# Patient Record
Sex: Male | Born: 1975 | Race: Black or African American | Hispanic: No | Marital: Single | Smoking: Never smoker
Health system: Southern US, Community
[De-identification: ages and names within clinical notes are randomized; demographics above are authoritative.]

## PROBLEM LIST (undated history)

## (undated) DIAGNOSIS — F840 Autistic disorder: Secondary | ICD-10-CM

---

## 1999-07-27 ENCOUNTER — Inpatient Hospital Stay (HOSPITAL_COMMUNITY): Admission: EM | Admit: 1999-07-27 | Discharge: 1999-07-28 | Payer: Self-pay | Admitting: *Deleted

## 2000-12-11 ENCOUNTER — Emergency Department (HOSPITAL_COMMUNITY): Admission: EM | Admit: 2000-12-11 | Discharge: 2000-12-11 | Payer: Self-pay | Admitting: Internal Medicine

## 2000-12-11 ENCOUNTER — Encounter: Payer: Self-pay | Admitting: Internal Medicine

## 2011-11-14 ENCOUNTER — Emergency Department (HOSPITAL_COMMUNITY)
Admission: EM | Admit: 2011-11-14 | Discharge: 2011-11-14 | Disposition: A | Discharge status: Home / Self Care | Payer: Medicare Other | Attending: Emergency Medicine | Admitting: Emergency Medicine

## 2011-11-14 ENCOUNTER — Encounter (HOSPITAL_COMMUNITY): Payer: Self-pay | Admitting: *Deleted

## 2011-11-14 DIAGNOSIS — K529 Noninfective gastroenteritis and colitis, unspecified: Secondary | ICD-10-CM

## 2011-11-14 DIAGNOSIS — F840 Autistic disorder: Secondary | ICD-10-CM | POA: Insufficient documentation

## 2011-11-14 DIAGNOSIS — R197 Diarrhea, unspecified: Secondary | ICD-10-CM | POA: Insufficient documentation

## 2011-11-14 DIAGNOSIS — R109 Unspecified abdominal pain: Secondary | ICD-10-CM | POA: Insufficient documentation

## 2011-11-14 DIAGNOSIS — E860 Dehydration: Secondary | ICD-10-CM

## 2011-11-14 HISTORY — DX: Autistic disorder: F84.0

## 2011-11-14 LAB — COMPREHENSIVE METABOLIC PANEL
ALT: 14 U/L (ref 0–53)
AST: 19 U/L (ref 0–37)
Albumin: 3.2 g/dL — ABNORMAL LOW (ref 3.5–5.2)
Alkaline Phosphatase: 51 U/L (ref 39–117)
BUN: 25 mg/dL — ABNORMAL HIGH (ref 6–23)
CO2: 27 mEq/L (ref 19–32)
Calcium: 8.6 mg/dL (ref 8.4–10.5)
Chloride: 101 mEq/L (ref 96–112)
Creatinine, Ser: 1.51 mg/dL — ABNORMAL HIGH (ref 0.50–1.35)
GFR calc Af Amer: 68 mL/min — ABNORMAL LOW (ref >90)
GFR calc non Af Amer: 58 mL/min — ABNORMAL LOW (ref >90)
Glucose, Bld: 113 mg/dL — ABNORMAL HIGH (ref 70–99)
Potassium: 3.2 mEq/L — ABNORMAL LOW (ref 3.5–5.1)
Sodium: 135 mEq/L (ref 135–145)
Total Bilirubin: 0.5 mg/dL (ref 0.3–1.2)
Total Protein: 6.5 g/dL (ref 6.0–8.3)

## 2011-11-14 LAB — DIFFERENTIAL
Basophils Absolute: 0 10*3/uL (ref 0.0–0.1)
Basophils Relative: 0 % (ref 0–1)
Eosinophils Absolute: 0 10*3/uL (ref 0.0–0.7)
Eosinophils Relative: 0 % (ref 0–5)
Lymphocytes Relative: 14 % (ref 12–46)
Lymphs Abs: 0.7 10*3/uL (ref 0.7–4.0)
Monocytes Absolute: 0.9 10*3/uL (ref 0.1–1.0)
Monocytes Relative: 17 % — ABNORMAL HIGH (ref 3–12)
Neutro Abs: 3.6 10*3/uL (ref 1.7–7.7)
Neutrophils Relative %: 69 % (ref 43–77)

## 2011-11-14 LAB — CBC
HCT: 37.3 % — ABNORMAL LOW (ref 39.0–52.0)
Hemoglobin: 12.1 g/dL — ABNORMAL LOW (ref 13.0–17.0)
MCH: 25.1 pg — ABNORMAL LOW (ref 26.0–34.0)
MCHC: 32.4 g/dL (ref 30.0–36.0)
MCV: 77.2 fL — ABNORMAL LOW (ref 78.0–100.0)
Platelets: 112 10*3/uL — ABNORMAL LOW (ref 150–400)
RBC: 4.83 MIL/uL (ref 4.22–5.81)
RDW: 14 % (ref 11.5–15.5)
WBC: 5.2 10*3/uL (ref 4.0–10.5)

## 2011-11-14 LAB — LIPASE, BLOOD: Lipase: 24 U/L (ref 11–59)

## 2011-11-14 MED ORDER — SODIUM CHLORIDE 0.9 % IV SOLN
Freq: Once | INTRAVENOUS | Status: AC
  Administered 2011-11-14 (×2): via INTRAVENOUS

## 2011-11-14 MED ORDER — KETOROLAC TROMETHAMINE 30 MG/ML IJ SOLN
30.0000 mg | Freq: Once | INTRAMUSCULAR | Status: AC
  Administered 2011-11-14: 30 mg via INTRAVENOUS
  Filled 2011-11-14: qty 1

## 2011-11-14 MED ORDER — ONDANSETRON HCL 4 MG/2ML IJ SOLN
4.0000 mg | Freq: Once | INTRAMUSCULAR | Status: AC
  Administered 2011-11-14: 4 mg via INTRAVENOUS
  Filled 2011-11-14: qty 2

## 2011-11-14 NOTE — Discharge Instructions (Signed)
Viral Gastroenteritis Viral gastroenteritis is also known as stomach flu. This condition affects the stomach and intestinal tract. It can cause sudden diarrhea and vomiting. The illness typically lasts 3 to 8 days. Most people develop an immune response that eventually gets rid of the virus. While this natural response develops, the virus can make you quite ill. CAUSES  Many different viruses can cause gastroenteritis, such as rotavirus or noroviruses. You can catch one of these viruses by consuming contaminated food or water. You may also catch a virus by sharing utensils or other personal items with an infected person or by touching a contaminated surface. SYMPTOMS  The most common symptoms are diarrhea and vomiting. These problems can cause a severe loss of body fluids (dehydration) and a body salt (electrolyte) imbalance. Other symptoms may include:  Fever.   Headache.   Fatigue.   Abdominal pain.  DIAGNOSIS  Your caregiver can usually diagnose viral gastroenteritis based on your symptoms and a physical exam. A stool sample may also be taken to test for the presence of viruses or other infections. TREATMENT  This illness typically goes away on its own. Treatments are aimed at rehydration. The most serious cases of viral gastroenteritis involve vomiting so severely that you are not able to keep fluids down. In these cases, fluids must be given through an intravenous line (IV). HOME CARE INSTRUCTIONS   Drink enough fluids to keep your urine clear or pale yellow. Drink small amounts of fluids frequently and increase the amounts as tolerated.   Ask your caregiver for specific rehydration instructions.   Avoid:   Foods high in sugar.   Alcohol.   Carbonated drinks.   Tobacco.   Juice.   Caffeine drinks.   Extremely hot or cold fluids.   Fatty, greasy foods.   Too much intake of anything at one time.   Dairy products until 24 to 48 hours after diarrhea stops.   You may  consume probiotics. Probiotics are active cultures of beneficial bacteria. They may lessen the amount and number of diarrheal stools in adults. Probiotics can be found in yogurt with active cultures and in supplements.   Wash your hands well to avoid spreading the virus.   Only take over-the-counter or prescription medicines for pain, discomfort, or fever as directed by your caregiver. Do not give aspirin to children. Antidiarrheal medicines are not recommended.   Ask your caregiver if you should continue to take your regular prescribed and over-the-counter medicines.   Keep all follow-up appointments as directed by your caregiver.  SEEK IMMEDIATE MEDICAL CARE IF:   You are unable to keep fluids down.   You do not urinate at least once every 6 to 8 hours.   You develop shortness of breath.   You notice blood in your stool or vomit. This may look like coffee grounds.   You have abdominal pain that increases or is concentrated in one small area (localized).   You have persistent vomiting or diarrhea.   You have a fever.   The patient is a child younger than 3 months, and he or she has a fever.   The patient is a child older than 3 months, and he or she has a fever and persistent symptoms.   The patient is a child older than 3 months, and he or she has a fever and symptoms suddenly get worse.   The patient is a baby, and he or she has no tears when crying.  MAKE SURE YOU:     Understand these instructions.   Will watch your condition.   Will get help right away if you are not doing well or get worse.  Document Released: 08/03/2005 Document Revised: 07/23/2011 Document Reviewed: 05/20/2011 [employer] Patient Information 2012 [employer]. 

## 2011-11-14 NOTE — ED Notes (Signed)
Per Dr. Judd Lien pt able to have crackers and drink. Peanut butter crackers and sprite given to pt.

## 2011-11-14 NOTE — ED Notes (Signed)
Pt 2nd liter still currently infusing.

## 2011-11-14 NOTE — ED Notes (Signed)
Pt's mother states that he has had diarrhea since yesterday. Also c/o upper abdominal pain. Denies vomiting or fever.

## 2011-11-14 NOTE — ED Provider Notes (Signed)
History   This chart was scribed for Geoffery Lyons, MD by Melba Coon. The patient was seen in room APA11/APA11 and the patient's care was started at 7:29AM.    CSN: [id number]  Arrival date & time 11/14/11  0650   First MD Initiated Contact with Patient 11/14/11 [phone]      Chief Complaint  Patient presents with  . Diarrhea    (Consider location/radiation/quality/duration/timing/severity/associated sxs/prior treatment) HPI Geoffrey White is a 36 y.o. male who presents to the Emergency Department complaining of persistent, moderate to severe diarrhea with an onset yesterday with associated abdominal pain. Hx provided by mother and pt. Mother states that "every time he eats, he just goes to the bathroom." Decreased appetite present. No HA, fever, neck pain, back pain, CP, nausea, emesis, or extremity pain, numbness, or tingling. No prior abd surgeries. No known allergies. No other pertinent medical problems.  Past Medical History  Diagnosis Date  . Autism     History reviewed. No pertinent past surgical history.  History reviewed. No pertinent family history.  History  Substance Use Topics  . Smoking status: Never Smoker   . Smokeless tobacco: Not on file  . Alcohol Use: No      Review of Systems 10 Systems reviewed and all are negative for acute change except as noted in the HPI.   Allergies  Review of patient's allergies indicates no known allergies.  Home Medications  No current outpatient prescriptions on file.  BP 98/66  Pulse 94  Temp(Src) 98.1 F (36.7 C) (Oral)  Resp 18  SpO2 99%  Physical Exam  Nursing note and vitals reviewed. Constitutional: He is oriented to person, place, and time. He appears well-developed and well-nourished.       Awake, alert, nontoxic appearance.  HENT:  Head: Normocephalic and atraumatic.  Mouth/Throat: Mucous membranes are dry.  Eyes: EOM are normal. Pupils are equal, round, and reactive to light. Right eye exhibits no  discharge. Left eye exhibits no discharge.  Neck: Neck supple.  Cardiovascular: Normal rate, regular rhythm and normal heart sounds.  Exam reveals no gallop and no friction rub.   No murmur heard. Pulmonary/Chest: Effort normal. He exhibits no tenderness.  Abdominal: Soft. There is no tenderness. There is no rebound.  Musculoskeletal: He exhibits no tenderness.       Baseline ROM, no obvious new focal weakness.  Neurological: He is alert and oriented to person, place, and time.       Mental status and motor strength appears baseline for patient and situation.  Skin: Skin is warm. No rash noted.  Psychiatric: He has a normal mood and affect.    ED Course  Procedures (including critical care time)  DIAGNOSTIC STUDIES: Oxygen Saturation is 99% on room air, normal by my interpretation.    COORDINATION OF CARE:  7:35AM - EDMD will order IV fluids, blood w/u and UA. 8:42AM - EDMD reviewed lab results; EDMD believes pt will be d/c 8:46AM - recheck; pt is feeling better; EDMD will order more fluids then pt ready for d/c   Results for orders placed during the hospital encounter of 11/14/11  CBC      Component Value Range   WBC 5.2  4.0 - 10.5 (K/uL)   RBC 4.83  4.22 - 5.81 (MIL/uL)   Hemoglobin 12.1 (*) 13.0 - 17.0 (g/dL)   HCT 47.8 (*) 29.5 - 52.0 (%)   MCV 77.2 (*) 78.0 - 100.0 (fL)   MCH 25.1 (*) 26.0 - 34.0 (pg)  MCHC 32.4  30.0 - 36.0 (g/dL)   RDW 16.1  09.6 - 04.5 (%)   Platelets 112 (*) 150 - 400 (K/uL)  DIFFERENTIAL      Component Value Range   Neutrophils Relative 69  43 - 77 (%)   Neutro Abs 3.6  1.7 - 7.7 (K/uL)   Lymphocytes Relative 14  12 - 46 (%)   Lymphs Abs 0.7  0.7 - 4.0 (K/uL)   Monocytes Relative 17 (*) 3 - 12 (%)   Monocytes Absolute 0.9  0.1 - 1.0 (K/uL)   Eosinophils Relative 0  0 - 5 (%)   Eosinophils Absolute 0.0  0.0 - 0.7 (K/uL)   Basophils Relative 0  0 - 1 (%)   Basophils Absolute 0.0  0.0 - 0.1 (K/uL)  COMPREHENSIVE METABOLIC PANEL       Component Value Range   Sodium 135  135 - 145 (mEq/L)   Potassium 3.2 (*) 3.5 - 5.1 (mEq/L)   Chloride 101  96 - 112 (mEq/L)   CO2 27  19 - 32 (mEq/L)   Glucose, Bld 113 (*) 70 - 99 (mg/dL)   BUN 25 (*) 6 - 23 (mg/dL)   Creatinine, Ser 4.09 (*) 0.50 - 1.35 (mg/dL)   Calcium 8.6  8.4 - 81.1 (mg/dL)   Total Protein 6.5  6.0 - 8.3 (g/dL)   Albumin 3.2 (*) 3.5 - 5.2 (g/dL)   AST 19  0 - 37 (U/L)   ALT 14  0 - 53 (U/L)   Alkaline Phosphatase 51  39 - 117 (U/L)   Total Bilirubin 0.5  0.3 - 1.2 (mg/dL)   GFR calc non Af Amer 58 (*) >90 (mL/min)   GFR calc Af Amer 68 (*) >90 (mL/min)  LIPASE, BLOOD      Component Value Range   Lipase 24  11 - 59 (U/L)    No results found.   No diagnosis found.    MDM  Patient with presentation and workup consistent with gastroenteritis.  Will treat with antiemetics, return prn.   I personally performed the services described in this documentation, which was scribed in my presence. The recorded information has been reviewed and considered.          Geoffery Lyons, MD 11/14/11 [phone]

## 2013-04-21 ENCOUNTER — Encounter (HOSPITAL_COMMUNITY): Payer: Self-pay

## 2013-04-21 ENCOUNTER — Emergency Department (INDEPENDENT_AMBULATORY_CARE_PROVIDER_SITE_OTHER)
Admission: EM | Admit: 2013-04-21 | Discharge: 2013-04-21 | Disposition: A | Discharge status: Home / Self Care | Payer: Medicare Other | Source: Home / Self Care | Attending: Family Medicine | Admitting: Family Medicine

## 2013-04-21 DIAGNOSIS — Z043 Encounter for examination and observation following other accident: Secondary | ICD-10-CM

## 2013-04-21 DIAGNOSIS — Z0000 Encounter for general adult medical examination without abnormal findings: Secondary | ICD-10-CM
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
## 2013-11-01 ENCOUNTER — Other Ambulatory Visit: Payer: Self-pay | Admitting: Internal Medicine

## 2013-11-01 DIAGNOSIS — N508 Other specified disorders of male genital organs: Secondary | ICD-10-CM

## 2013-11-10 ENCOUNTER — Ambulatory Visit
Admission: RE | Admit: 2013-11-10 | Discharge: 2013-11-10 | Disposition: A | Discharge status: Home / Self Care | Payer: Medicare Other | Source: Ambulatory Visit | Attending: Internal Medicine | Admitting: Internal Medicine

## 2013-11-10 DIAGNOSIS — N508 Other specified disorders of male genital organs: Secondary | ICD-10-CM

## 2015-03-14 IMAGING — US US SCROTUM
1 series · 14 of 25 positions shown · non-contrast
Comparison: None.

CLINICAL DATA: Right scrotal pain and lump

EXAM:
ULTRASOUND OF SCROTUM
TECHNIQUE: Complete ultrasound examination of the testicles, epididymis, and
other scrotal structures was performed.

[Series 1: us scrotum · 0.07mm/px · 14 of 42 slices shown]
[im 1/42]
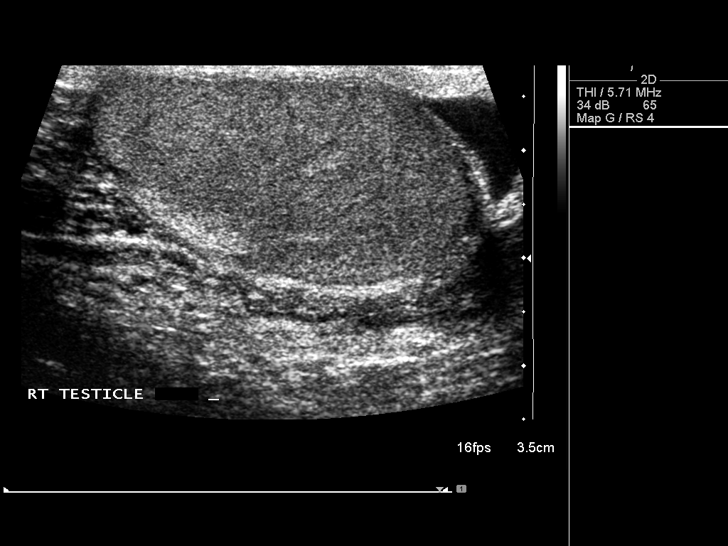
[im 4/42]
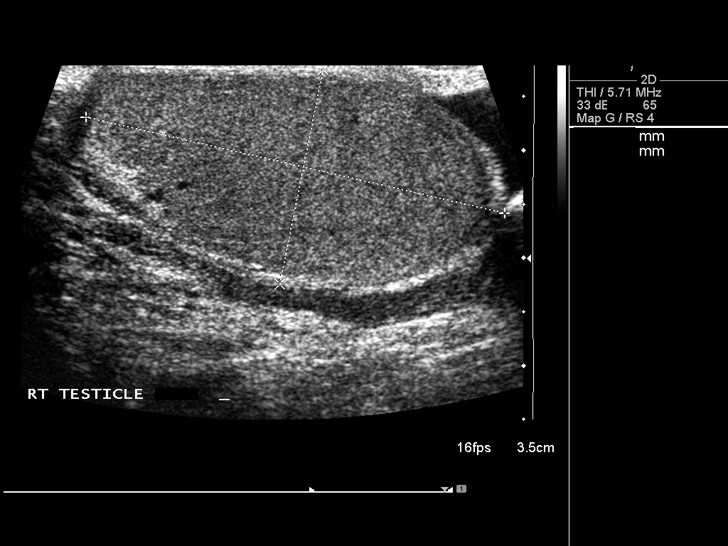
[im 7/42]
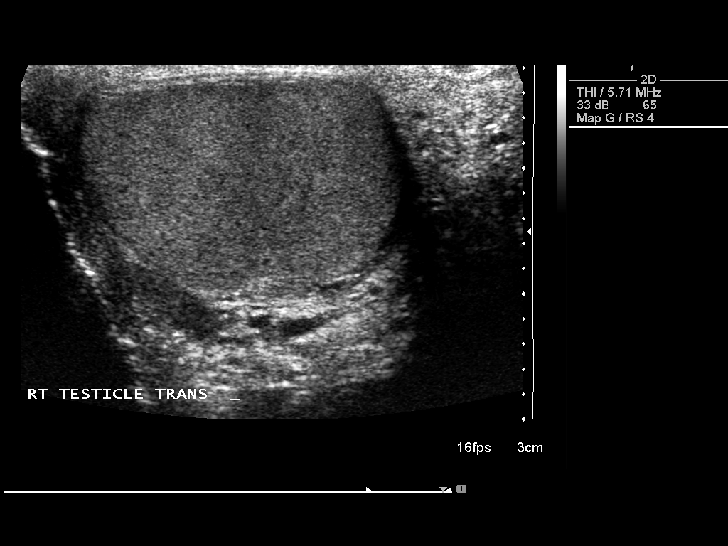
[im 11/42]
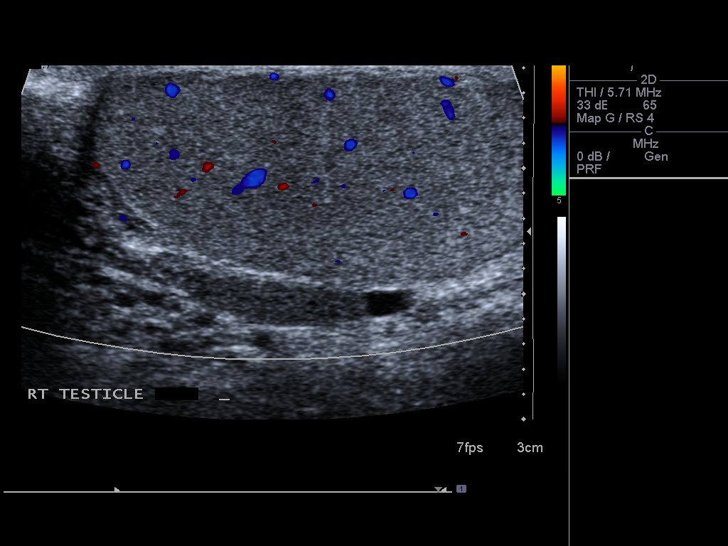
[im 14/42]
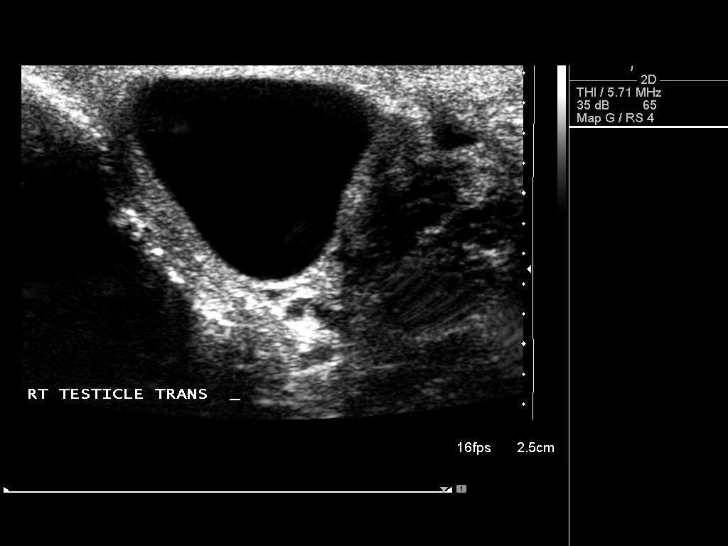
[im 16/42]
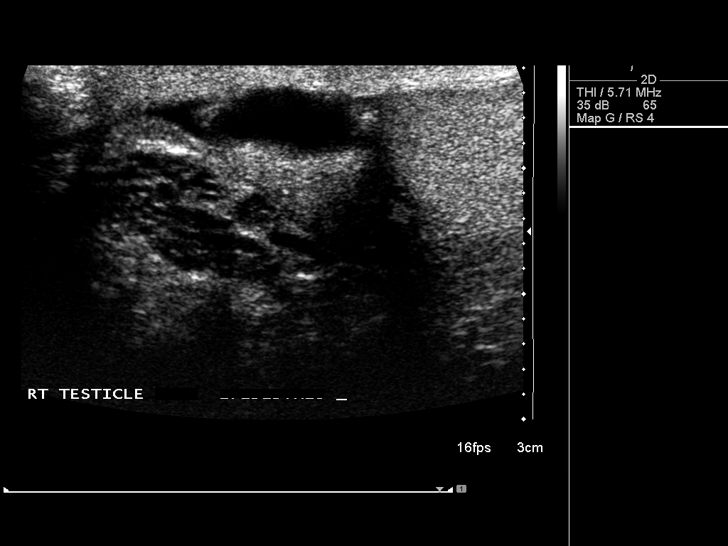
[im 19/42]
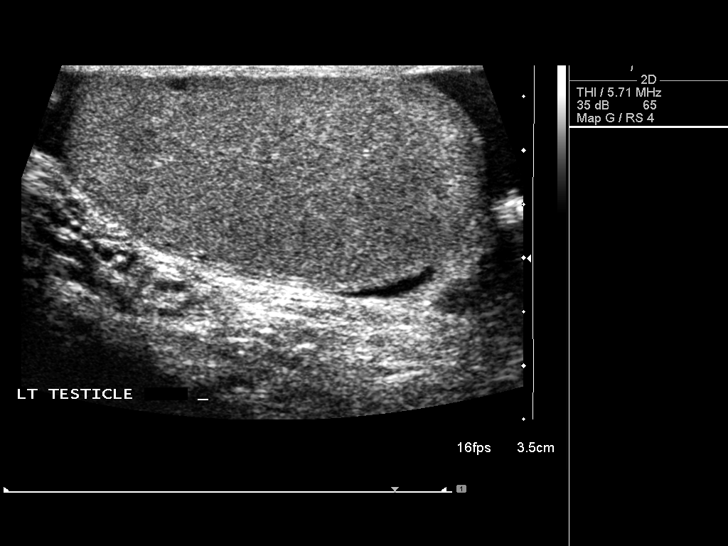
[im 23/42]
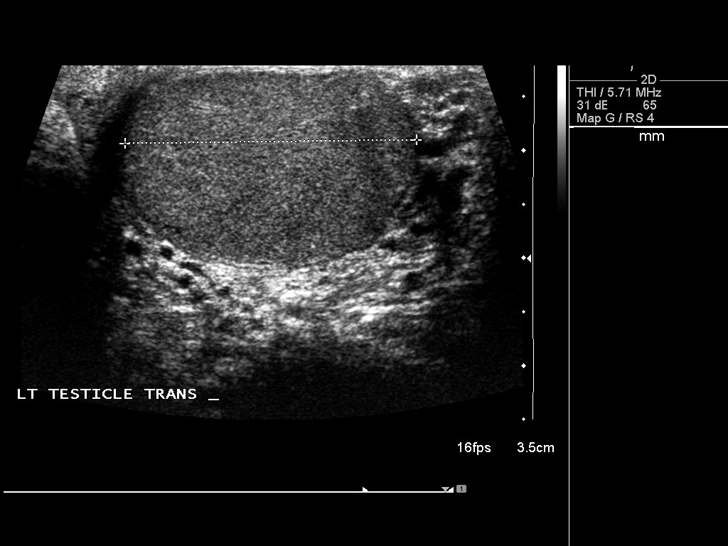
[im 26/42]
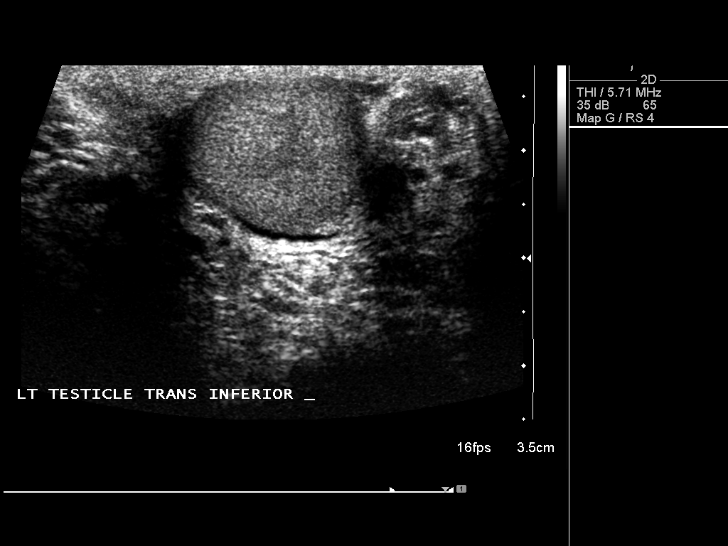
[im 28/42]
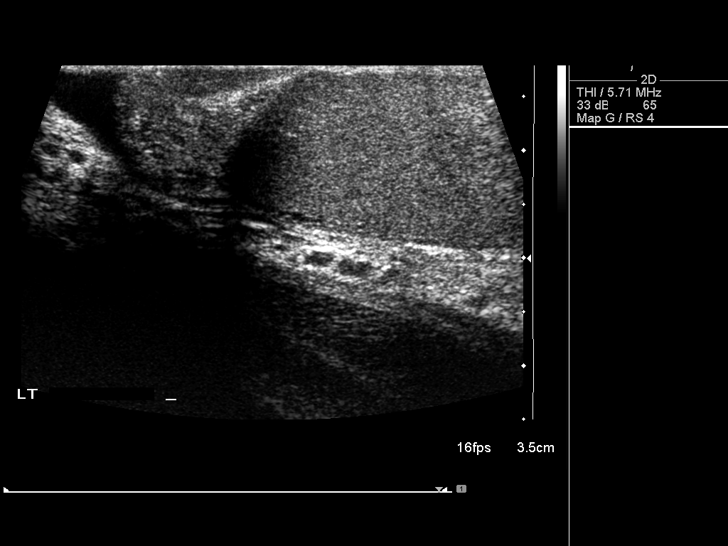
[im 31/42]
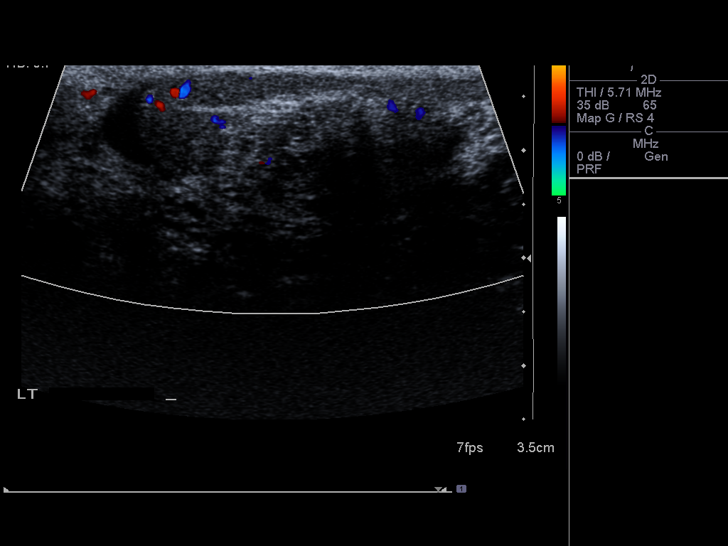
[im 35/42]
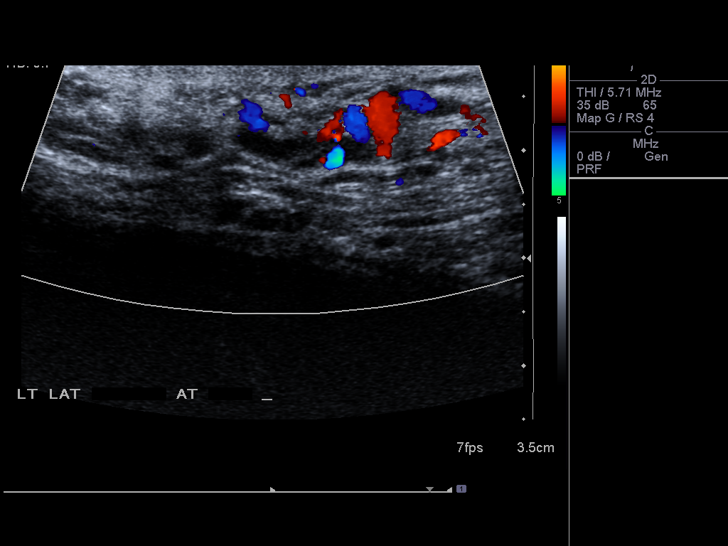
[im 38/42]
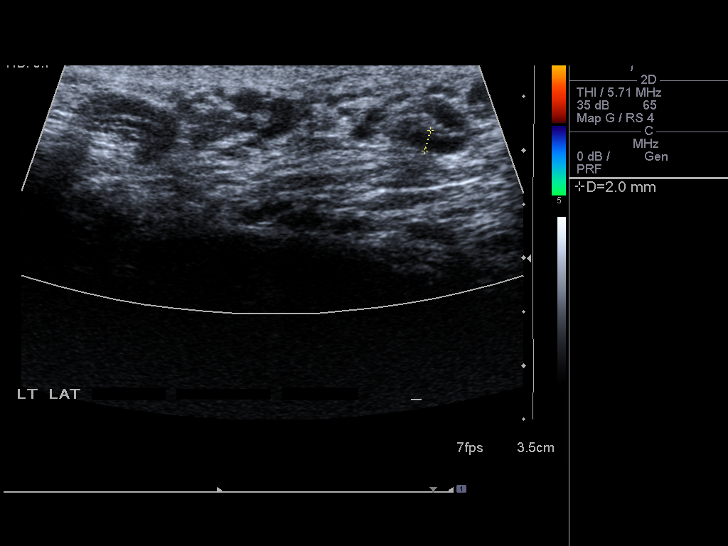
[im 42/42]
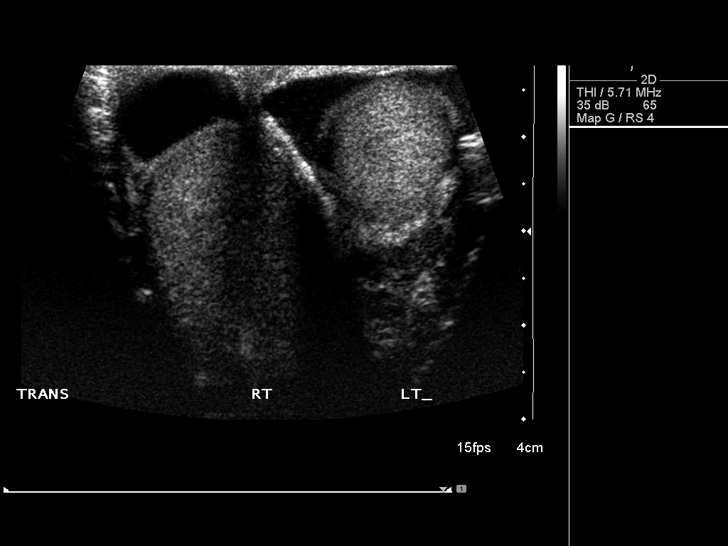

[14 of 25 positions shown; findings below may reference images not displayed]

FINDINGS: Right testicle

Measurements: 4.1 x 1.9 x 2.5 cm. No mass or microlithiasis
visualized.

Left testicle

Measurements: 4.0 x 2.0 x 2.7 cm. No mass or microlithiasis
visualized.

Right epididymis: 2.3 x 1.3 x 1.7 cm simple cyst, consistent with
epididymal cyst or spermatocele.

Left epididymis:  Normal in size and appearance.

Hydrocele:  Small volume fluid identified in the left hemiscrotum.

Varicocele: Prominent venous plexus identified in the left
hemiscrotum. Patient was unable to perform Valsalva maneuver for
varicocele assessment.
IMPRESSION: No testicular mass lesion.

2.3 cm right epididymal cyst or spermatocele.

## 2019-06-06 ENCOUNTER — Other Ambulatory Visit: Payer: Self-pay

## 2019-06-06 DIAGNOSIS — Z20822 Contact with and (suspected) exposure to covid-19: Secondary | ICD-10-CM

## 2019-06-07 LAB — NOVEL CORONAVIRUS, NAA: SARS-CoV-2, NAA: NOT DETECTED

## 2019-06-08 ENCOUNTER — Telehealth: Payer: Self-pay | Admitting: General Practice

## 2019-06-08 NOTE — Telephone Encounter (Signed)
Negative COVID results given. Patient results "NOT Detected." Caller expressed understanding. ° °

## 2020-10-09 ENCOUNTER — Other Ambulatory Visit: Payer: Self-pay

## 2020-10-09 ENCOUNTER — Inpatient Hospital Stay (HOSPITAL_COMMUNITY): Payer: Medicare Other

## 2020-10-09 ENCOUNTER — Inpatient Hospital Stay (HOSPITAL_COMMUNITY): Payer: Medicare Other | Attending: Hematology | Admitting: Hematology

## 2020-10-09 DIAGNOSIS — F840 Autistic disorder: Secondary | ICD-10-CM | POA: Insufficient documentation

## 2020-10-09 DIAGNOSIS — C73 Malignant neoplasm of thyroid gland: Secondary | ICD-10-CM | POA: Diagnosis present

## 2020-10-09 DIAGNOSIS — F2089 Other schizophrenia: Secondary | ICD-10-CM | POA: Insufficient documentation

## 2020-10-09 DIAGNOSIS — Z79899 Other long term (current) drug therapy: Secondary | ICD-10-CM | POA: Diagnosis not present

## 2020-10-09 DIAGNOSIS — D34 Benign neoplasm of thyroid gland: Secondary | ICD-10-CM

## 2020-10-09 LAB — TSH: TSH: 1.735 u[IU]/mL (ref 0.350–4.500)

## 2020-10-09 NOTE — Patient Instructions (Signed)
[place] at [facility] Discharge Instructions  You were seen and examined today by Dr. Delton White. Dr. Delton White is a medical oncologist, meaning he specializes in treating cancer diagnoses with medications. Dr. Delton White discussed your past medical history, family history of cancer and the events that led to you being here today.  Your recent ultrasound showed some nodules in the thyroid. 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 The biopsy that was performed was a fine needle aspiration and is not definitive. This is a type of cancer that arises in the thyroid. Dr. Delton White has recommended additional blood work to assess how the thyroid is working. Dr. Delton White has also recommended a CT of his neck and a referral to a surgeon who will likely remove the thyroid gland. It is possible to live without a thyroid gland but will require you to be on medication to replace the function of the thyroid.  Follow-up as scheduled.   Thank you for choosing [facility] at [facility] to provide your oncology and hematology care.  To afford each patient quality time with our provider, please arrive at least 15 minutes before your scheduled appointment time.   If you have a lab appointment with the [facility] please come in thru the Main Entrance and check in at the main information desk.  You need to re-schedule your appointment should you arrive 10 or more minutes late.  We strive to give you quality time with our providers, and arriving late affects you and other patients whose appointments are after yours.  Also, if you no show three or more times for appointments you may be dismissed from the clinic at the providers discretion.     Again, thank you for choosing [facility].  Our hope is that these requests will decrease the amount of time that you wait before being seen by our physicians.        _____________________________________________________________  Should you have questions after your visit to [facility], please contact our office at [phone] and follow the prompts.  Our office hours are 8:00 a.m. and 4:30 p.m. Monday - Friday.  Please note that voicemails left after 4:00 p.m. may not be returned until the following business day.  We are closed weekends and major holidays.  You do have access to a nurse 24-7, just call the main number to the clinic [phone] and do not press any options, hold on the line and a nurse will answer the phone.    For prescription refill requests, have your pharmacy contact our office and allow 72 hours.    Due to Covid, you will need to wear a mask upon entering the hospital. If you do not have a mask, a mask will be given to you at the Main Entrance upon arrival. For doctor visits, patients may have 1 support person age 37 or older with them. For treatment visits, patients can not have anyone with them due to social distancing guidelines and our immunocompromised population.

## 2020-10-09 NOTE — Progress Notes (Signed)
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 45 y.o. 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 Stoney Bang from [facility]. He noticed a right neck mass while he was at home over Christmas and went to [facility] urgent care on 02/11 after having a week of right neck mass pain.  Patient is a poor historian and today he is accompanied by his mother, Geoffrey White, and caretaker. According to his mother, she noticed the right neck swelling around Christmas 2021. He admits to having right neck pain. He has lost about 10 lbs in the last 3 months, according to his caregiver. He denies having trouble swallowing, change in voice, feeding habits, though his cough has worsened during nights and mornings.  Geoffrey White lives in [facility] in [place] due to his multiple developmental disabilities. He lifts weights, does therapy one-on-one, goes to the day program 2-3 times per week, and has an entire schedule planned for him. According to his mother, his maternal side denies any cancer history, while his father's family history is unknown.  MEDICAL HISTORY:  Past Medical History:  Diagnosis Date  . Autism     SURGICAL HISTORY: No past surgical history on file.  SOCIAL HISTORY: Social History   Socioeconomic History  . Marital status: Single    Spouse name: Not on file  . Number of children: Not on file  . Years of education: Not on file  . Highest education level: Not on file  Occupational History  . Not on file  Tobacco Use  . Smoking status: Never Smoker  . Smokeless tobacco: Not on file  Substance and Sexual Activity  . Alcohol use: No  . Drug use: No  . Sexual activity: Not on  file  Other Topics Concern  . Not on file  Social History Narrative  . Not on file   Social Determinants of Health   Financial Resource Strain: Not on file  Food Insecurity: Not on file  Transportation Needs: Not on file  Physical Activity: Not on file  Stress: Not on file  Social Connections: Not on file  Intimate Partner Violence: Not on file    FAMILY HISTORY: No family history on file.  ALLERGIES:  has No Known Allergies.  MEDICATIONS:  Current Outpatient Medications  Medication Sig Dispense Refill  . benztropine (COGENTIN) 1 MG tablet Take 1 mg by mouth 2 (two) times daily.    . calcium-vitamin D (OSCAL) 250-125 MG-UNIT per tablet Take 1 tablet by mouth daily.    . divalproex (DEPAKOTE ER) 500 MG 24 hr tablet Take by mouth.    . divalproex (DEPAKOTE) 250 MG DR tablet Take 250 mg by mouth 3 (three) times daily.    . ergocalciferol (VITAMIN D2) 1.25 MG (50000 UT) capsule Take by mouth.    . haloperidol (HALDOL) 5 MG tablet Take 5 mg by mouth 2 (two) times daily.    Marland Kitchen LORazepam (ATIVAN) 1 MG tablet Take 1 mg by mouth every 8 (eight) hours.    . propranolol (INDERAL) 20 MG tablet Take by mouth.    . risperiDONE (RISPERDAL) 3 MG tablet Take 3 mg by mouth 2 (two) times daily.     No current facility-administered medications  for this visit.    REVIEW OF SYSTEMS:   Review of Systems  Constitutional: Positive for fatigue (90%) and unexpected weight change (lost 10 lbs in 3 months). Negative for appetite change.  HENT:   Positive for lump/mass (mass on R side of neck). Negative for trouble swallowing and voice change.   Respiratory: Positive for cough (worsened on nights and mornings).   Musculoskeletal: Positive for neck pain (5/10 neck pain).  All other systems reviewed and are negative.    PHYSICAL EXAMINATION: ECOG PERFORMANCE STATUS: 0 - Asymptomatic  Vitals:   10/09/20 0806  BP: 128/67  Pulse: 73  Resp: 18  Temp: (!) 97.5 F (36.4 C)  SpO2: 99%   Filed  Weights   10/09/20 0806  Weight: 195 lb 4.8 oz (88.6 kg)   Physical Exam Vitals reviewed.  Constitutional:      Appearance: Normal appearance.  HENT:     Mouth/Throat:     Lips: No lesions.     Mouth: No oral lesions.     Dentition: No gum lesions.     Tongue: No lesions.     Palate: No mass.     Pharynx: No posterior oropharyngeal erythema.     Tonsils: No tonsillar exudate.  Neck:     Thyroid: Thyroid mass (5 x 5 cm freely mobile mass on R lobe) present. No thyroid tenderness.  Cardiovascular:     Rate and Rhythm: Normal rate and regular rhythm.     Pulses: Normal pulses.     Heart sounds: Normal heart sounds.  Pulmonary:     Effort: Pulmonary effort is normal.     Breath sounds: Normal breath sounds.  Chest:  Breasts:     Right: No axillary adenopathy or supraclavicular adenopathy.     Left: No axillary adenopathy or supraclavicular adenopathy.    Abdominal:     Palpations: Abdomen is soft. There is no hepatomegaly, splenomegaly or mass.     Tenderness: There is no abdominal tenderness.     Hernia: No hernia is present.  Musculoskeletal:     Right lower leg: No edema.     Left lower leg: No edema.  Lymphadenopathy:     Cervical: No cervical adenopathy.     Upper Body:     Right upper body: No supraclavicular, axillary or pectoral adenopathy.     Left upper body: No supraclavicular, axillary or pectoral adenopathy.  Neurological:     General: No focal deficit present.     Mental Status: He is alert and oriented to person, place, and time.  Psychiatric:        Mood and Affect: Mood normal.        Behavior: Behavior normal.      LABORATORY DATA:  I have reviewed the data as listed CBC Latest Ref Rng & Units 11/14/2011  WBC 4.0 - 10.5 K/uL 5.2  Hemoglobin 13.0 - 17.0 g/dL 12.1(L)  Hematocrit 39.0 - 52.0 % 37.3(L)  Platelets 150 - 400 K/uL 112(L)   CMP Latest Ref Rng & Units 11/14/2011  Glucose 70 - 99 mg/dL 113(H)  BUN 6 - 23 mg/dL 25(H)  Creatinine 0.50  - 1.35 mg/dL 1.51(H)  Sodium 135 - 145 mEq/L 135  Potassium 3.5 - 5.1 mEq/L 3.2(L)  Chloride 96 - 112 mEq/L 101  CO2 19 - 32 mEq/L 27  Calcium 8.4 - 10.5 mg/dL 8.6  Total Protein 6.0 - 8.3 g/dL 6.5  Total Bilirubin 0.3 - 1.2 mg/dL 0.5  Alkaline Phos 39 -  117 U/L 51  AST 0 - 37 U/L 19  ALT 0 - 53 U/L 14    RADIOGRAPHIC STUDIES: I have personally reviewed the radiological images as listed and agreed with the findings in the report. No results found.  ASSESSMENT:  1.  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 -Thyroid ultrasound on 09/03/2020 at [facility] Findings suggestive of multinodular goiter.  Solitary bilateral thyroid nodules, right mid lobe measuring 7.2 x 6 x 4.1 cm.  Nodule #2 in the left mid thyroid measures 2.1 x 1.5 x 1.3 cm. -Thyroid FNA of right thyroid nodule on 09/10/2020 suspicious for Hurthle cell lesion, Bethesda category 4. -FNA of the left thyroid lesion shows scant follicular epithelium, Bethesda category 1, nondiagnostic. -He denies any hoarseness or dysphagia. -Reported weight loss of 5 to 10 pounds in the last 3 months, but his appetite has been good.  2.  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 -His mother Kendra Opitz works at [employer]. -No family history of malignancies.    PLAN:  1.  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 -Recommend CT of the neck with contrast to evaluate for any lymph node involvement. -He will need total thyroidectomy.  We will make a referral to Dr. Harlow Asa at [place]. -We will also check TSH, thyroglobulin and antithyroglobulin antibodies as baseline.  All questions were answered. The patient knows to call the clinic with any problems, questions or concerns.   Derek Jack, MD, 10/09/20 8:49 AM   [place] [phone]   I, Milinda Antis, am acting as a scribe for Dr. Sanda Linger.  I, Derek Jack MD, have reviewed the above documentation for accuracy and completeness, and I agree with the above.

## 2020-10-10 ENCOUNTER — Encounter (HOSPITAL_COMMUNITY): Payer: Self-pay

## 2020-10-10 ENCOUNTER — Encounter (HOSPITAL_COMMUNITY): Payer: Self-pay | Admitting: Lab

## 2020-10-10 LAB — THYROGLOBULIN ANTIBODY: Thyroglobulin Antibody: <1 IU/mL (ref 0.0–0.9)

## 2020-10-10 NOTE — Progress Notes (Signed)
I met with patient, patient's caregiver and patient's mother during initial visit with Dr. Katragadda. I provided my contact information and explained my role in the patient's care. I encouraged all parties to call with questions or concerns. 

## 2020-10-10 NOTE — Progress Notes (Unsigned)
Referral sent to [facility] Dr Harlow Asa.  Records faxed on 2/24

## 2020-10-15 ENCOUNTER — Other Ambulatory Visit: Payer: Self-pay

## 2020-10-15 ENCOUNTER — Encounter (HOSPITAL_COMMUNITY): Payer: Self-pay

## 2020-10-15 ENCOUNTER — Ambulatory Visit (HOSPITAL_COMMUNITY)
Admission: RE | Admit: 2020-10-15 | Discharge: 2020-10-15 | Disposition: A | Discharge status: Home / Self Care | Payer: Medicare Other | Source: Ambulatory Visit | Attending: Hematology | Admitting: Hematology

## 2020-10-15 DIAGNOSIS — D34 Benign neoplasm of thyroid gland: Secondary | ICD-10-CM | POA: Insufficient documentation

## 2020-10-15 MED ORDER — IOHEXOL 300 MG/ML  SOLN
75.0000 mL | Freq: Once | INTRAMUSCULAR | Status: AC | PRN
  Administered 2020-10-15: 75 mL via INTRAVENOUS

## 2020-10-17 ENCOUNTER — Inpatient Hospital Stay (HOSPITAL_COMMUNITY): Payer: Medicare Other | Attending: Hematology | Admitting: Hematology

## 2020-10-17 VITALS — BP 122/72 | HR 73 | Temp 96.9°F | Resp 18 | Wt 188.4 lb

## 2020-10-17 DIAGNOSIS — D34 Benign neoplasm of thyroid gland: Secondary | ICD-10-CM | POA: Diagnosis not present

## 2020-10-17 DIAGNOSIS — M542 Cervicalgia: Secondary | ICD-10-CM | POA: Diagnosis not present

## 2020-10-17 DIAGNOSIS — C73 Malignant neoplasm of thyroid gland: Secondary | ICD-10-CM | POA: Diagnosis not present

## 2020-10-17 DIAGNOSIS — R634 Abnormal weight loss: Secondary | ICD-10-CM | POA: Insufficient documentation

## 2020-10-17 DIAGNOSIS — Z79899 Other long term (current) drug therapy: Secondary | ICD-10-CM | POA: Insufficient documentation

## 2020-10-17 NOTE — Patient Instructions (Signed)
[place] at [facility] Discharge Instructions  You were seen today by Dr. Delton Coombes. He went over your recent results and scans. You will be referred to Dr. Lavone Orn for further treatment of the Hurthle cell lesion. Dr. Delton Coombes will see you back 4 weeks after surgery for labs and follow up.   Thank you for choosing [place] at [facility] to provide your oncology and hematology care.  To afford each patient quality time with our provider, please arrive at least 15 minutes before your scheduled appointment time.   If you have a lab appointment with the [facility] please come in thru the Main Entrance and check in at the main information desk  You need to re-schedule your appointment should you arrive 10 or more minutes late.  We strive to give you quality time with our providers, and arriving late affects you and other patients whose appointments are after yours.  Also, if you no show three or more times for appointments you may be dismissed from the clinic at the providers discretion.     Again, thank you for choosing [facility].  Our hope is that these requests will decrease the amount of time that you wait before being seen by our physicians.       _____________________________________________________________  Should you have questions after your visit to [facility], please contact our office at [phone] between the hours of 8:00 a.m. and 4:30 p.m.  Voicemails left after 4:00 p.m. will not be returned until the following business day.  For prescription refill requests, have your pharmacy contact our office and allow 72 hours.    Cancer Center Support Programs:   > Cancer Support Group  2nd Tuesday of the month 1pm-2pm, Journey Room

## 2020-10-17 NOTE — Progress Notes (Signed)
[name],  [id number]   CLINIC:  Medical Oncology/Hematology  PCP:  Neale Burly, MD [place] / [place] [id number] [phone]   REASON FOR VISIT:  Follow-up for Hurthle cell lesion  PRIOR THERAPY: None  NGS Results: Not done  CURRENT THERAPY: Under work-up  BRIEF ONCOLOGIC HISTORY:  Oncology History   No history exists.    CANCER STAGING: Cancer Staging No matching staging information was found for the patient.  INTERVAL HISTORY:  Geoffrey White, a 45 y.o. 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 Rikki was last seen on 10/09/2020.   Patient is a poor historian. Today he is accompanied by his mother and father and he reports feeling okay. He continues having neck pain.   REVIEW OF SYSTEMS:  Review of Systems  Constitutional: Positive for fatigue (25%). Negative for appetite change.  Musculoskeletal: Positive for neck pain (4/10 neck pain).  All other systems reviewed and are negative.   PAST MEDICAL/SURGICAL HISTORY:  Past Medical History:  Diagnosis Date  . Autism    No past surgical history on file.  SOCIAL HISTORY:  Social History   Socioeconomic History  . Marital status: Single    Spouse name: Not on file  . Number of children: Not on file  . Years of education: Not on file  . Highest education level: Not on file  Occupational History  . Not on file  Tobacco Use  . Smoking status: Never Smoker  . Smokeless tobacco: Not on file  Substance and Sexual Activity  . Alcohol use: No  . Drug use: No  . Sexual activity: Not on file  Other Topics Concern  . Not on file  Social History Narrative  . Not on file   Social Determinants of Health   Financial Resource Strain: Not on file  Food Insecurity: Not on file  Transportation Needs: Not on file  Physical Activity: Not on file  Stress: Not on file  Social Connections: Not on file  Intimate Partner  Violence: Not on file    FAMILY HISTORY:  No family history on file.  CURRENT MEDICATIONS:  Current Outpatient Medications  Medication Sig Dispense Refill  . benztropine (COGENTIN) 1 MG tablet Take 1 mg by mouth 2 (two) times daily.    . calcium-vitamin D (OSCAL) 250-125 MG-UNIT per tablet Take 1 tablet by mouth daily.    . divalproex (DEPAKOTE ER) 500 MG 24 hr tablet Take by mouth.    . divalproex (DEPAKOTE) 250 MG DR tablet Take 250 mg by mouth 3 (three) times daily.    . ergocalciferol (VITAMIN D2) 1.25 MG (50000 UT) capsule Take by mouth.    . haloperidol (HALDOL) 5 MG tablet Take 5 mg by mouth 2 (two) times daily.    Marland Kitchen LORazepam (ATIVAN) 1 MG tablet Take 1 mg by mouth every 8 (eight) hours.    . propranolol (INDERAL) 20 MG tablet Take by mouth.    . risperiDONE (RISPERDAL) 3 MG tablet Take 3 mg by mouth 2 (two) times daily.     No current facility-administered medications for this visit.    ALLERGIES:  No Known Allergies  PHYSICAL EXAM:  Performance status (ECOG): 0 - Asymptomatic  Vitals:   10/17/20 1411  BP: 122/72  Pulse: 73  Resp: 18  Temp: (!) 96.9 F (36.1 C)  SpO2: 100%   Wt Readings from Last 3 Encounters:  10/17/20 188 lb 6.4  oz (85.5 kg)  10/09/20 195 lb 4.8 oz (88.6 kg)   Physical Exam Vitals reviewed.  Constitutional:      Appearance: Normal appearance.  Neurological:     General: No focal deficit present.     Mental Status: He is alert and oriented to person, place, and time. Mental status is at baseline.  Psychiatric:        Mood and Affect: Mood normal.        Behavior: Behavior normal.      LABORATORY DATA:  I have reviewed the labs as listed.  CBC Latest Ref Rng & Units 11/14/2011  WBC 4.0 - 10.5 K/uL 5.2  Hemoglobin 13.0 - 17.0 g/dL 12.1(L)  Hematocrit 39.0 - 52.0 % 37.3(L)  Platelets 150 - 400 K/uL 112(L)   CMP Latest Ref Rng & Units 11/14/2011  Glucose 70 - 99 mg/dL 113(H)  BUN 6 - 23 mg/dL 25(H)  Creatinine 0.50 - 1.35 mg/dL  1.51(H)  Sodium 135 - 145 mEq/L 135  Potassium 3.5 - 5.1 mEq/L 3.2(L)  Chloride 96 - 112 mEq/L 101  CO2 19 - 32 mEq/L 27  Calcium 8.4 - 10.5 mg/dL 8.6  Total Protein 6.0 - 8.3 g/dL 6.5  Total Bilirubin 0.3 - 1.2 mg/dL 0.5  Alkaline Phos 39 - 117 U/L 51  AST 0 - 37 U/L 19  ALT 0 - 53 U/L 14    DIAGNOSTIC IMAGING:  I have independently reviewed the scans and discussed with the patient. CT SOFT TISSUE NECK W CONTRAST  Result Date: 10/16/2020 CLINICAL DATA:  Hurthle cell carcinoma EXAM: CT NECK WITH CONTRAST TECHNIQUE: Multidetector CT imaging of the neck was performed using the standard protocol following the bolus administration of intravenous contrast. CONTRAST:  51mL OMNIPAQUE IOHEXOL 300 MG/ML  SOLN COMPARISON:  Thyroid ultrasound 09/03/2020 FINDINGS: Pharynx and larynx: 2 low-density polyps are present in the posterior nasal passageway nasopharynx. Each of these measures approximately 10 x 15 mm. In addition, there is obstruction of the right maxillary sinus which could be due to a mass or polyp. Direct visualization recommended. Remainder of the pharynx is normal. Larynx normal. Trachea displaced to the left due to large right thyroid mass. Salivary glands: No inflammation, mass, or stone. Thyroid: Large mass in the right lobe of the thyroid measuring approximately 5.3 x 4.6 x 6.4 cm. There is heterogeneous enhancement of the mass which appears solid and consistent with known tumor. Prominent draining vein superior to the mass. Trachea is displaced to the left. Left lobe of the thyroid normal without nodule Lymph nodes: No enlarged or pathologic lymph nodes in the neck. Vascular: Normal vascular enhancement. Jugular vein patent bilaterally. Limited intracranial: Negative Visualized orbits: Negative orbit.  Bilateral cataract extraction Mastoids and visualized paranasal sinuses: Complete opacification right maxillary sinus. This appears obstructed due to a lesion in the nasal passageway. Possible  polyp or mass. Direct visualization recommended. Minimal mucosal edema left maxillary sinus. Remaining sinuses clear. Skeleton: No acute skeletal abnormality Moderately large right-sided disc protrusion at C3-4 with associated spurring and significant right foraminal encroachment. Correlate with radiculopathy. Upper chest: Lung apices clear bilaterally. Other: None IMPRESSION: Large mass right lobe of thyroid compatible with known tumor. No enlarged lymph nodes in the neck. Nasal polyps on the right. Obstruction of the right maxillary sinus. Recommend direct visualization to rule out neoplasm. Electronically Signed   By: Franchot Gallo M.D.   On: 10/16/2020 10:46     ASSESSMENT:  1.  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 -Thyroid ultrasound on 09/03/2020 at [facility] Findings suggestive of multinodular goiter.  Solitary bilateral thyroid nodules, right mid lobe measuring 7.2 x 6 x 4.1 cm.  Nodule #2 in the left mid thyroid measures 2.1 x 1.5 x 1.3 cm. -Thyroid FNA of right thyroid nodule on 09/10/2020 suspicious for Hurthle cell lesion, Bethesda category 4. -FNA of the left thyroid lesion shows scant follicular epithelium, Bethesda category 1, nondiagnostic. -He denies any hoarseness or dysphagia. -Reported weight loss of 5 to 10 pounds in the last 3 months, but his appetite has been good.  2.  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 -His mother Kendra Opitz works at [employer]. -No family history of malignancies.   PLAN:  1.  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  There is heterogeneous enhancement of the mass which appears solid with the trachea displaced to the left.  No enlarged lymph nodes in the  neck. -TSH was normal and thyroglobulin antibodies undetectable.  Thyroglobulin level is pending. -Recommend surgical evaluation by Dr. Harlow Asa. -RTC 4 to 6 weeks after surgery.   Orders placed this encounter:  No orders of the defined types were placed in this encounter.    Derek Jack, MD [place] [phone]   I, Milinda Antis, am acting as a scribe for Dr. Sanda Linger.  I, Derek Jack MD, have reviewed the above documentation for accuracy and completeness, and I agree with the above. Sherren Mocha,

## 2020-10-21 LAB — THYROGLOBULIN LEVEL: Thyroglobulin: >500 ng/mL — ABNORMAL HIGH

## 2020-10-23 ENCOUNTER — Ambulatory Visit: Payer: Self-pay | Admitting: Surgery

## 2020-12-02 NOTE — Patient Instructions (Addendum)
DUE TO COVID-19 ONLY ONE VISITOR IS ALLOWED TO COME WITH YOU AND STAY IN THE WAITING ROOM ONLY DURING PRE OP AND PROCEDURE DAY OF SURGERY. THE 1 VISITOR  MAY VISIT WITH YOU AFTER SURGERY IN YOUR PRIVATE ROOM DURING VISITING HOURS ONLY!  YOU NEED TO HAVE A COVID 19 TEST ON: 12/10/20 @ 11:00 AM , THIS TEST MUST BE DONE BEFORE SURGERY,  COVID TESTING SITE [place] [address], IT IS ON THE RIGHT GOING OUT [address] APPROXIMATELY  2 MINUTES PAST [employer] ON THE RIGHT. ONCE YOUR COVID TEST IS COMPLETED,  PLEASE BEGIN THE QUARANTINE INSTRUCTIONS AS OUTLINED IN YOUR HANDOUT.                Geoffrey White   Your procedure is scheduled on: 12/13/20   Report to [facility] Main  Entrance   Report to admitting at: 11:00 AM     Call this number if you have problems the morning of surgery [phone]    Remember: Do not eat solid food :After Midnight. Clear liquids until: 10:00 am.  CLEAR LIQUID DIET  Foods Allowed                                                                     Foods Excluded  Coffee and tea, regular and decaf                             liquids that you cannot  Plain Jell-O any favor except red or purple                                           see through such as: Fruit ices (not with fruit pulp)                                     milk, soups, orange juice  Iced Popsicles                                    All solid food Carbonated beverages, regular and diet                                    Cranberry, grape and apple juices Sports drinks like Gatorade Lightly seasoned clear broth or consume(fat free) Sugar, honey syrup  Sample Menu Breakfast                                Lunch                                     Supper Cranberry juice  Beef broth                            Chicken broth Jell-O                                     Grape juice                           Apple juice Coffee or tea                         Jell-O                                      Popsicle                                                Coffee or tea                        Coffee or tea  _____________________________________________________________________  BRUSH YOUR TEETH MORNING OF SURGERY AND RINSE YOUR MOUTH OUT, NO CHEWING GUM CANDY OR MINTS.    Take these medicines the morning of surgery with A SIP OF WATER: benztropine,divalproex,haldol,levocetirizine,risperidone,lorazepam,propanolol.                            You may not have any metal on your body including hair pins and              piercings  Do not wear jewelry, lotions, powders or perfumes, deodorant             Men may shave face and neck.   Do not bring valuables to the hospital. [place].  Contacts, dentures or bridgework may not be worn into surgery.  Leave suitcase in the car. After surgery it may be brought to your room.     Patients discharged the day of surgery will not be allowed to drive home. IF YOU ARE HAVING SURGERY AND GOING HOME THE SAME DAY, YOU MUST HAVE AN ADULT TO DRIVE YOU HOME AND BE WITH YOU FOR 24 HOURS. YOU MAY GO HOME BY TAXI OR UBER OR ORTHERWISE, BUT AN ADULT MUST ACCOMPANY YOU HOME AND STAY WITH YOU FOR 24 HOURS.  Name and phone number of your driver:  Special Instructions: N/A              Please read over the following fact sheets you were given: _____________________________________________________________________         [facility] - Preparing for Surgery Before surgery, you can play an important role.  Because skin is not sterile, your skin needs to be as free of germs as possible.  You can reduce the number of germs on your skin by washing with CHG (chlorahexidine gluconate) soap before surgery.  CHG is an antiseptic cleaner which kills germs and bonds with the skin to continue killing germs even after washing. Please DO  NOT use if you have an allergy to CHG  or antibacterial soaps.  If your skin becomes reddened/irritated stop using the CHG and inform your nurse when you arrive at Short Stay. Do not shave (including legs and underarms) for at least 48 hours prior to the first CHG shower.  You may shave your face/neck. Please follow these instructions carefully:  1.  Shower with CHG Soap the night before surgery and the  morning of Surgery.  2.  If you choose to wash your hair, wash your hair first as usual with your  normal  shampoo.  3.  After you shampoo, rinse your hair and body thoroughly to remove the  shampoo.                           4.  Use CHG as you would any other liquid soap.  You can apply chg directly  to the skin and wash                       Gently with a scrungie or clean washcloth.  5.  Apply the CHG Soap to your body ONLY FROM THE NECK DOWN.   Do not use on face/ open                           Wound or open sores. Avoid contact with eyes, ears mouth and genitals (private parts).                       Wash face,  Genitals (private parts) with your normal soap.             6.  Wash thoroughly, paying special attention to the area where your surgery  will be performed.  7.  Thoroughly rinse your body with warm water from the neck down.  8.  DO NOT shower/wash with your normal soap after using and rinsing off  the CHG Soap.                9.  Pat yourself dry with a clean towel.            10.  Wear clean pajamas.            11.  Place clean sheets on your bed the night of your first shower and do not  sleep with pets. Day of Surgery : Do not apply any lotions/deodorants the morning of surgery.  Please wear clean clothes to the hospital/surgery center.  FAILURE TO FOLLOW THESE INSTRUCTIONS MAY RESULT IN THE CANCELLATION OF YOUR SURGERY PATIENT SIGNATURE_________________________________  NURSE SIGNATURE__________________________________  ________________________________________________________________________

## 2020-12-03 ENCOUNTER — Encounter (HOSPITAL_COMMUNITY)
Admission: RE | Admit: 2020-12-03 | Discharge: 2020-12-03 | Disposition: A | Discharge status: Home / Self Care | Payer: Medicare Other | Source: Ambulatory Visit | Attending: Surgery | Admitting: Surgery

## 2020-12-03 ENCOUNTER — Encounter (HOSPITAL_COMMUNITY): Payer: Self-pay

## 2020-12-03 ENCOUNTER — Other Ambulatory Visit: Payer: Self-pay

## 2020-12-03 ENCOUNTER — Ambulatory Visit (HOSPITAL_COMMUNITY)
Admission: RE | Admit: 2020-12-03 | Discharge: 2020-12-03 | Disposition: A | Discharge status: Home / Self Care | Payer: Medicare Other | Source: Ambulatory Visit | Attending: Anesthesiology | Admitting: Anesthesiology

## 2020-12-03 DIAGNOSIS — E079 Disorder of thyroid, unspecified: Secondary | ICD-10-CM

## 2020-12-03 LAB — CBC
HCT: 38.9 % — ABNORMAL LOW (ref 39.0–52.0)
Hemoglobin: 12.1 g/dL — ABNORMAL LOW (ref 13.0–17.0)
MCH: 24.2 pg — ABNORMAL LOW (ref 26.0–34.0)
MCHC: 31.1 g/dL (ref 30.0–36.0)
MCV: 77.8 fL — ABNORMAL LOW (ref 80.0–100.0)
Platelets: 197 10*3/uL (ref 150–400)
RBC: 5 MIL/uL (ref 4.22–5.81)
RDW: 15 % (ref 11.5–15.5)
WBC: 5.8 10*3/uL (ref 4.0–10.5)
nRBC: 0 % (ref 0.0–0.2)

## 2020-12-03 NOTE — Progress Notes (Signed)
COVID Vaccine Completed: Yes Date COVID Vaccine completed: COVID vaccine manufacturer: Pfizer    [employer]   PCP - Dr. Neale Burly. Cardiologist -   Chest x-ray -  EKG -  Stress Test -  ECHO -  Cardiac Cath -  Pacemaker/ICD device last checked:  Sleep Study -  CPAP -   Fasting Blood Sugar -  Checks Blood Sugar _____ times a day  Blood Thinner Instructions: Aspirin Instructions: Last Dose:  Anesthesia review: Hx: autism.  Patient denies shortness of breath, fever, cough and chest pain at PAT appointment   Patient verbalized understanding of instructions that were given to them at the PAT appointment. Patient was also instructed that they will need to review over the PAT instructions again at home before surgery.

## 2020-12-05 ENCOUNTER — Inpatient Hospital Stay (HOSPITAL_COMMUNITY): Payer: Medicare Other | Attending: Hematology | Admitting: Hematology

## 2020-12-05 ENCOUNTER — Other Ambulatory Visit: Payer: Self-pay

## 2020-12-05 VITALS — BP 111/69 | HR 78 | Temp 98.2°F | Resp 17 | Wt 200.3 lb

## 2020-12-05 DIAGNOSIS — C73 Malignant neoplasm of thyroid gland: Secondary | ICD-10-CM | POA: Diagnosis not present

## 2020-12-05 DIAGNOSIS — D34 Benign neoplasm of thyroid gland: Secondary | ICD-10-CM | POA: Diagnosis not present

## 2020-12-05 NOTE — Patient Instructions (Signed)
[place] at [facility] Discharge Instructions  You were seen today by Dr. Delton Coombes. He went over your recent results. Keep your surgery on April 29th. Dr. Delton Coombes will see you back in 6 weeks for labs and follow up.   Thank you for choosing [place] at [facility] to provide your oncology and hematology care.  To afford each patient quality time with our provider, please arrive at least 15 minutes before your scheduled appointment time.   If you have a lab appointment with the [facility] please come in thru the Main Entrance and check in at the main information desk  You need to re-schedule your appointment should you arrive 10 or more minutes late.  We strive to give you quality time with our providers, and arriving late affects you and other patients whose appointments are after yours.  Also, if you no show three or more times for appointments you may be dismissed from the clinic at the providers discretion.     Again, thank you for choosing [facility].  Our hope is that these requests will decrease the amount of time that you wait before being seen by our physicians.       _____________________________________________________________  Should you have questions after your visit to [facility], please contact our office at [phone] between the hours of 8:00 a.m. and 4:30 p.m.  Voicemails left after 4:00 p.m. will not be returned until the following business day.  For prescription refill requests, have your pharmacy contact our office and allow 72 hours.    Cancer Center Support Programs:   > Cancer Support Group  2nd Tuesday of the month 1pm-2pm, Journey Room

## 2020-12-05 NOTE — Progress Notes (Signed)
[place], [place] [address]   CLINIC:  Medical Oncology/Hematology  PCP:  Neale Burly, MD [place] / [place] [id number] [phone]   REASON FOR VISIT:  Follow-up for Hurthle cell lesion  PRIOR THERAPY: None  NGS Results: Not done  CURRENT THERAPY: Surveillance  BRIEF ONCOLOGIC HISTORY:  Oncology History   No history exists.    CANCER STAGING: Cancer Staging No matching staging information was found for the patient.  INTERVAL HISTORY:  Geoffrey White, a 45 y.o. 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 Geoffrey White was last seen on 10/17/2020.   The patient is a poor historian. Today he is accompanied by his caretaker and he reports feeling okay. He denies having trouble swallowing and he is able to eat everything. His caretaker reports that he sometimes complains of feeling cold occasionally for the past 3 weeks.  He is scheduled to have a total thyroidectomy on 04/29 with Dr. Armandina Gemma.   REVIEW OF SYSTEMS:  Review of Systems  Constitutional: Positive for chills and fatigue (75%). Negative for appetite change.  HENT:   Negative for trouble swallowing.   All other systems reviewed and are negative.   PAST MEDICAL/SURGICAL HISTORY:  Past Medical History:  Diagnosis Date  . Autism    No past surgical history on file.  SOCIAL HISTORY:  Social History   Socioeconomic History  . Marital status: Single    Spouse name: Not on file  . Number of children: Not on file  . Years of education: Not on file  . Highest education level: Not on file  Occupational History  . Not on file  Tobacco Use  . Smoking status: Never Smoker  . Smokeless tobacco: Never Used  Vaping Use  . Vaping Use: Never used  Substance and Sexual Activity  . Alcohol use: No  . Drug use: No  . Sexual activity: Not on file  Other Topics Concern  . Not on file  Social History Narrative  . Not on file    Social Determinants of Health   Financial Resource Strain: Not on file  Food Insecurity: Not on file  Transportation Needs: Not on file  Physical Activity: Not on file  Stress: Not on file  Social Connections: Not on file  Intimate Partner Violence: Not on file    FAMILY HISTORY:  No family history on file.  CURRENT MEDICATIONS:  Current Outpatient Medications  Medication Sig Dispense Refill  . benztropine (COGENTIN) 1 MG tablet Take 1 mg by mouth in the morning, at noon, and at bedtime.    . divalproex (DEPAKOTE ER) 500 MG 24 hr tablet Take 500 mg by mouth in the morning and at bedtime.    . ergocalciferol (VITAMIN D2) 1.25 MG (50000 UT) capsule Take 50,000 Units by mouth every Thursday.    . haloperidol (HALDOL) 5 MG tablet Take 5 mg by mouth 3 (three) times daily.    Marland Kitchen levocetirizine (XYZAL) 5 MG tablet Take 5 mg by mouth every morning.    Marland Kitchen LORazepam (ATIVAN) 1 MG tablet Take 1 mg by mouth 2 (two) times daily.    . Omega-3 Fatty Acids (FISH OIL) 1000 MG CAPS Take 1 capsule by mouth in the morning, at noon, and at bedtime.    . propranolol (INDERAL) 20 MG tablet Take 20 mg by mouth 2 (two) times daily.    . risperiDONE (RISPERDAL) 3 MG tablet Take 3 mg  by mouth 2 (two) times daily.     No current facility-administered medications for this visit.    ALLERGIES:  No Known Allergies  PHYSICAL EXAM:  Performance status (ECOG): 0 - Asymptomatic  Vitals:   12/05/20 1344  BP: 111/69  Pulse: 78  Resp: 17  Temp: 98.2 F (36.8 C)  SpO2: 99%   Wt Readings from Last 3 Encounters:  12/05/20 200 lb 4.8 oz (90.9 kg)  10/17/20 188 lb 6.4 oz (85.5 kg)  10/09/20 195 lb 4.8 oz (88.6 kg)   Physical Exam Vitals reviewed.  Constitutional:      Appearance: Normal appearance.  Neck:     Thyroid: Thyroid mass (R thyroid mass 5 cm x 5 cm) present.  Cardiovascular:     Rate and Rhythm: Normal rate and regular rhythm.     Pulses: Normal pulses.     Heart sounds: Normal heart  sounds.  Pulmonary:     Effort: Pulmonary effort is normal.     Breath sounds: Normal breath sounds.  Abdominal:     Palpations: Abdomen is soft.  Musculoskeletal:     Right lower leg: No edema.     Left lower leg: No edema.  Neurological:     General: No focal deficit present.     Mental Status: He is alert and oriented to person, place, and time.  Psychiatric:        Mood and Affect: Mood normal.        Behavior: Behavior normal.      LABORATORY DATA:  I have reviewed the labs as listed.  CBC Latest Ref Rng & Units 12/03/2020 11/14/2011  WBC 4.0 - 10.5 K/uL 5.8 5.2  Hemoglobin 13.0 - 17.0 g/dL 12.1(L) 12.1(L)  Hematocrit 39.0 - 52.0 % 38.9(L) 37.3(L)  Platelets 150 - 400 K/uL 197 112(L)   CMP Latest Ref Rng & Units 11/14/2011  Glucose 70 - 99 mg/dL 113(H)  BUN 6 - 23 mg/dL 25(H)  Creatinine 0.50 - 1.35 mg/dL 1.51(H)  Sodium 135 - 145 mEq/L 135  Potassium 3.5 - 5.1 mEq/L 3.2(L)  Chloride 96 - 112 mEq/L 101  CO2 19 - 32 mEq/L 27  Calcium 8.4 - 10.5 mg/dL 8.6  Total Protein 6.0 - 8.3 g/dL 6.5  Total Bilirubin 0.3 - 1.2 mg/dL 0.5  Alkaline Phos 39 - 117 U/L 51  AST 0 - 37 U/L 19  ALT 0 - 53 U/L 14    DIAGNOSTIC IMAGING:  I have independently reviewed the scans and discussed with the patient. DG Chest 2 View per protocol  Result Date: 12/04/2020 CLINICAL DATA:  Preop for thyroid surgery EXAM: CHEST - 2 VIEW COMPARISON:  CT neck October 15 2020 FINDINGS: The heart size is within normal limits. Left for deviation of the trachea related to known right thyroid mass. No focal consolidation. No pleural effusion. No pneumothorax. The visualized skeletal structures are unremarkable. IMPRESSION: No active cardiopulmonary disease. Electronically Signed   By: Dahlia Bailiff MD   On: 12/04/2020 09:24     ASSESSMENT:  1. 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 -Thyroid ultrasound on 09/03/2020 at [facility]  Findings suggestive of multinodular goiter. Solitary bilateral thyroid nodules, right mid lobe measuring 7.2 x 6 x 4.1 cm. Nodule #2 in the left mid thyroid measures 2.1 x 1.5 x 1.3 cm. -Thyroid FNA of right thyroid nodule on 09/10/2020 suspicious for Hurthle cell lesion, Bethesda category 4. -  FNA of the left thyroid lesion shows scant follicular epithelium, Bethesda category 1, nondiagnostic. -He denies any hoarseness or dysphagia. -Reported weight loss of 5 to 10 pounds in the last 3 months, but his appetite has been good.  2. Social/family history: -He is a group home resident [employer] transport [employer] in [place]) due to schizophrenia and intellectual disabilities. -His mother Kendra Opitz works at [employer]. -No family history of malignancies.   PLAN:  1. 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  Antithyroglobulin antibodies and TSH were normal. - He does not have any dysphagia at this time.  No weight loss. - No palpable adenopathy in the neck. - He is scheduled for total thyroidectomy on 12/13/2020. - Recommend follow-up 1 month after surgery.   Orders placed this encounter:  No orders of the defined types were placed in this encounter.    Geoffrey Jack, MD [place] [phone]   I, Milinda Antis, am acting as a scribe for Dr. Sanda Linger.  I, Geoffrey Jack MD, have reviewed the above documentation for accuracy and completeness, and I agree with the above.

## 2020-12-08 ENCOUNTER — Encounter (HOSPITAL_COMMUNITY): Payer: Self-pay | Admitting: Surgery

## 2020-12-08 DIAGNOSIS — E042 Nontoxic multinodular goiter: Secondary | ICD-10-CM | POA: Diagnosis present

## 2020-12-08 DIAGNOSIS — D440 Neoplasm of uncertain behavior of thyroid gland: Secondary | ICD-10-CM | POA: Diagnosis present

## 2020-12-08 NOTE — H&P (Signed)
General Surgery [facility]  Wanda Plump DOB: 1976-05-28 Single / Language: Cleophus Molt / Race: Black or African American Male   History of Present Illness  The patient is a 45 year old male who presents with a complaint of Enlarged thyroid.  CHIEF COMPLAINT: Hurthle cell neoplasm of thyroid, multiple thyroid nodules  Patient is referred by Dr. Derek Jack for surgical evaluation and management of a Hurthle cell neoplasm of the thyroid. Patient's primary care physician is Dr. Sherrie Sport. Patient was noted approximately one month ago to have a mass in the right neck by his mother. Patient underwent evaluation in [place], [place], including an ultrasound examination showing bilateral thyroid nodules with a greater than 7 cm mass in the right thyroid lobe. Fine-needle aspiration biopsy was performed on each nodule. The dominant nodule in the right lobe demonstrated Hurthle cell changes, Bethesda category IV. The nodule in the left lobe provided insufficient material for diagnosis, Bethesda category I. Patient has developed some mild compressive symptoms. He is had no prior head or neck surgery. There are no immediate family members with any history of thyroid neoplasm or other endocrine neoplasm. Patient is accompanied today by his mother and father. Patient underwent CT scan of the neck on October 16, 2020. This demonstrated a 5.3 x 4.6 x 6.4 cm mass in the right thyroid lobe with heterogeneous enhancement. There was no significant lymphadenopathy identified. Patient lives in a group home.   Past Surgical History No pertinent past surgical history   Allergies  No Known Drug Allergies  Allergies Reconciled   Medication History  LORazepam (1MG  Tablet, Oral) Active. Divalproex Sodium ER (500MG  Tablet ER 24HR, Oral) Active. risperiDONE (3MG  Tablet, Oral) Active. Propranolol HCl (20MG  Tablet, Oral) Active. Vitamin D (Ergocalciferol) (1.25  MG(50000 UT) Capsule, Oral) Active. Levocetirizine Dihydrochloride (5MG  Tablet, Oral) Active. Haloperidol (5MG  Tablet, Oral) Active. Benztropine Mesylate (1MG  Tablet, Oral) Active. Medications Reconciled  Social History  Caffeine use  Carbonated beverages. No alcohol use  No drug use  Tobacco use  Never smoker.  Family History Family history unknown  First Degree Relatives   Other Problems  Thyroid Disease   Review of Systems  General Not Present- Appetite Loss, Chills, Fatigue, Fever, Night Sweats, Weight Gain and Weight Loss. Skin Not Present- Change in Wart/Mole, Dryness, Hives, Jaundice, New Lesions, Non-Healing Wounds, Rash and Ulcer. HEENT Not Present- Earache, Hearing Loss, Hoarseness, Nose Bleed, Oral Ulcers, Ringing in the Ears, Seasonal Allergies, Sinus Pain, Sore Throat, Visual Disturbances, Wears glasses/contact lenses and Yellow Eyes. Respiratory Not Present- Bloody sputum, Chronic Cough, Difficulty Breathing, Snoring and Wheezing. Breast Not Present- Breast Mass, Breast Pain, Nipple Discharge and Skin Changes. Cardiovascular Not Present- Chest Pain, Difficulty Breathing Lying Down, Leg Cramps, Palpitations, Rapid Heart Rate, Shortness of Breath and Swelling of Extremities. Gastrointestinal Not Present- Abdominal Pain, Bloating, Bloody Stool, Change in Bowel Habits, Chronic diarrhea, Constipation, Difficulty Swallowing, Excessive gas, Gets full quickly at meals, Hemorrhoids, Indigestion, Nausea, Rectal Pain and Vomiting. Male Genitourinary Not Present- Blood in Urine, Change in Urinary Stream, Frequency, Impotence, Nocturia, Painful Urination, Urgency and Urine Leakage. Musculoskeletal Not Present- Back Pain, Joint Pain, Joint Stiffness, Muscle Pain, Muscle Weakness and Swelling of Extremities. Neurological Not Present- Decreased Memory, Fainting, Headaches, Numbness, Seizures, Tingling, Tremor, Trouble walking and Weakness. Endocrine Not Present- Cold  Intolerance, Excessive Hunger, Hair Changes, Heat Intolerance, Hot flashes and New Diabetes. Hematology Not Present- Blood Thinners, Easy Bruising, Excessive bleeding, Gland problems, HIV and Persistent Infections.  Vitals  Weight: 190.25  lb Height: 73in Body Surface Area: 2.11 m Body Mass Index: 25.1 kg/m  Temp.: 98.61F  Pulse: 87 (Regular)  P.OX: 99% (Room air) BP: 130/40(Sitting, Left Arm, Standard)  Physical Exam    GENERAL APPEARANCE Development: normal Nutritional status: normal Gross deformities: none  SKIN Rash, lesions, ulcers: none Induration, erythema: none Nodules: none palpable  EYES Conjunctiva and lids: normal Pupils: equal and reactive Iris: normal bilaterally  EARS, NOSE, MOUTH, THROAT External ears: no lesion or deformity External nose: no lesion or deformity Hearing: grossly normal Due to Covid-19 pandemic, patient is wearing a mask.  NECK Symmetric: no Trachea: deviation to left Thyroid: There is a visible dominant mass in the right thyroid lobe. On palpation this measures approximately 6 cm in size, smooth, soft, mobile, and nontender. Trachea is slightly deviated to the left. Palpation of the left thyroid lobe shows no discrete nodules or masses. There is no associated lymphadenopathy.  CHEST Respiratory effort: normal Retraction or accessory muscle use: no Breath sounds: normal bilaterally Rales, rhonchi, wheeze: none  CARDIOVASCULAR Auscultation: regular rhythm, normal rate Murmurs: none Pulses: radial pulse 2+ palpable Lower extremity edema: none  MUSCULOSKELETAL Station and gait: normal Digits and nails: no clubbing or cyanosis Muscle strength: grossly normal all extremities Range of motion: grossly normal all extremities Deformity: none  LYMPHATIC Cervical: none palpable Supraclavicular: none palpable    Assessment & Plan   NEOPLASM OF UNCERTAIN BEHAVIOR OF THYROID GLAND (D44.0) MULTIPLE THYROID NODULES  (E04.2)  Patient is referred by Dr. Delton Coombes for surgical evaluation and management of a Hurthle cell neoplasm of the thyroid and multiple thyroid nodules.  Patient provided with a copy of "The Thyroid Book: Medical and Surgical Treatment of Thyroid Problems", published by [employer], 16 pages. Book reviewed and explained to patient during visit today.  Patient has a dominant mass in the right thyroid lobe which measures approximately 7 cm in greatest dimension. Fine-needle aspiration shows cytologic atypia with Hurthle cells. There is a risk of malignancy of approximately 30%. There is another nodule in the left thyroid lobe for which fine-needle aspiration yielded insufficient material for diagnosis. Patient is having mild compressive symptoms. I have recommended proceeding with total thyroidectomy as the procedure of choice. We discussed performing thyroid lobectomy. We discussed the risk and benefits of the procedure including the risk of injury to recurrent laryngeal nerves and parathyroid glands. The risk of permanent alteration to his voice quality is approximately 2%. We discussed the size and location of the surgical incision. We discussed the hospital stay to be anticipated. We discussed his postoperative recovery. We discussed the need for lifelong thyroid hormone replacement. The patient and his family understand and wish to proceed with surgery in the near future.  The risks and benefits of the procedure have been discussed at length with the patient. The patient understands the proposed procedure, potential alternative treatments, and the course of recovery to be expected. All of the patient's questions have been answered at this time. The patient wishes to proceed with surgery.  Armandina Gemma, MD [facility] Office: [phone]

## 2020-12-09 ENCOUNTER — Other Ambulatory Visit (HOSPITAL_COMMUNITY): Payer: Self-pay

## 2020-12-09 DIAGNOSIS — D34 Benign neoplasm of thyroid gland: Secondary | ICD-10-CM

## 2020-12-10 ENCOUNTER — Other Ambulatory Visit (HOSPITAL_COMMUNITY)
Admission: RE | Admit: 2020-12-10 | Discharge: 2020-12-10 | Disposition: A | Discharge status: Home / Self Care | Payer: Medicare Other | Source: Ambulatory Visit | Attending: Surgery | Admitting: Surgery

## 2020-12-10 DIAGNOSIS — Z20822 Contact with and (suspected) exposure to covid-19: Secondary | ICD-10-CM | POA: Diagnosis not present

## 2020-12-10 DIAGNOSIS — Z01812 Encounter for preprocedural laboratory examination: Secondary | ICD-10-CM | POA: Diagnosis present

## 2020-12-11 LAB — SARS CORONAVIRUS 2 (TAT 6-24 HRS): SARS Coronavirus 2: NEGATIVE

## 2020-12-13 ENCOUNTER — Ambulatory Visit (HOSPITAL_COMMUNITY): Payer: Medicare Other | Admitting: Certified Registered Nurse Anesthetist

## 2020-12-13 ENCOUNTER — Encounter (HOSPITAL_COMMUNITY)
Admission: RE | Disposition: A | Discharge status: Home / Self Care | Payer: Self-pay | Source: Home / Self Care | Attending: Surgery

## 2020-12-13 ENCOUNTER — Encounter (HOSPITAL_COMMUNITY): Payer: Self-pay | Admitting: Surgery

## 2020-12-13 ENCOUNTER — Other Ambulatory Visit: Payer: Self-pay

## 2020-12-13 ENCOUNTER — Ambulatory Visit (HOSPITAL_COMMUNITY)
Admission: RE | Admit: 2020-12-13 | Discharge: 2020-12-14 | Disposition: A | Discharge status: Home / Self Care | Payer: Medicare Other | Attending: Surgery | Admitting: Surgery

## 2020-12-13 DIAGNOSIS — Z79899 Other long term (current) drug therapy: Secondary | ICD-10-CM | POA: Insufficient documentation

## 2020-12-13 DIAGNOSIS — D440 Neoplasm of uncertain behavior of thyroid gland: Secondary | ICD-10-CM | POA: Diagnosis present

## 2020-12-13 DIAGNOSIS — D34 Benign neoplasm of thyroid gland: Secondary | ICD-10-CM | POA: Diagnosis present

## 2020-12-13 DIAGNOSIS — E042 Nontoxic multinodular goiter: Secondary | ICD-10-CM | POA: Diagnosis present

## 2020-12-13 HISTORY — PX: THYROIDECTOMY: SHX17

## 2020-12-13 LAB — TYPE AND SCREEN
ABO/RH(D): B NEG
Antibody Screen: NEGATIVE

## 2020-12-13 LAB — ABO/RH: ABO/RH(D): B NEG

## 2020-12-13 SURGERY — THYROIDECTOMY
Anesthesia: General | Site: Neck

## 2020-12-13 MED ORDER — CALCIUM CARBONATE 1250 (500 CA) MG PO TABS
2.0000 | ORAL_TABLET | Freq: Three times a day (TID) | ORAL | Status: DC
  Administered 2020-12-14: 1000 mg via ORAL
  Filled 2020-12-13 (×2): qty 1

## 2020-12-13 MED ORDER — MIDAZOLAM HCL 2 MG/2ML IJ SOLN
INTRAMUSCULAR | Status: AC
  Filled 2020-12-13: qty 2

## 2020-12-13 MED ORDER — PHENYLEPHRINE HCL-NACL 10-0.9 MG/250ML-% IV SOLN
INTRAVENOUS | Status: DC | PRN
  Administered 2020-12-13: 80 ug/min via INTRAVENOUS

## 2020-12-13 MED ORDER — HALOPERIDOL 5 MG PO TABS
5.0000 mg | ORAL_TABLET | Freq: Three times a day (TID) | ORAL | Status: DC
  Administered 2020-12-13 – 2020-12-14 (×3): 5 mg via ORAL
  Filled 2020-12-13 (×4): qty 1

## 2020-12-13 MED ORDER — BENZTROPINE MESYLATE 0.5 MG PO TABS
1.0000 mg | ORAL_TABLET | Freq: Every day | ORAL | Status: DC
  Administered 2020-12-14: 1 mg via ORAL
  Filled 2020-12-13: qty 2

## 2020-12-13 MED ORDER — ACETAMINOPHEN 650 MG RE SUPP: 650.0000 mg | Freq: Four times a day (QID) | RECTAL | Status: DC | PRN

## 2020-12-13 MED ORDER — TRAMADOL HCL 50 MG PO TABS: 50.0000 mg | ORAL_TABLET | Freq: Four times a day (QID) | ORAL | 0 refills | Status: DC | PRN

## 2020-12-13 MED ORDER — SUGAMMADEX SODIUM 200 MG/2ML IV SOLN
INTRAVENOUS | Status: DC | PRN
  Administered 2020-12-13: 160 mg via INTRAVENOUS
  Administered 2020-12-13: 200 mg via INTRAVENOUS

## 2020-12-13 MED ORDER — ONDANSETRON HCL 4 MG/2ML IJ SOLN: 4.0000 mg | Freq: Four times a day (QID) | INTRAMUSCULAR | Status: DC | PRN

## 2020-12-13 MED ORDER — SODIUM CHLORIDE 0.45 % IV SOLN: INTRAVENOUS | Status: DC

## 2020-12-13 MED ORDER — ACETAMINOPHEN 160 MG/5ML PO SOLN: 1000.0000 mg | Freq: Once | ORAL | Status: DC | PRN

## 2020-12-13 MED ORDER — CALCIUM CARBONATE ANTACID 500 MG PO CHEW: 2.0000 | CHEWABLE_TABLET | Freq: Three times a day (TID) | ORAL | 1 refills | Status: DC

## 2020-12-13 MED ORDER — HYDROMORPHONE HCL 1 MG/ML IJ SOLN: 1.0000 mg | INTRAMUSCULAR | Status: DC | PRN

## 2020-12-13 MED ORDER — DEXAMETHASONE SODIUM PHOSPHATE 10 MG/ML IJ SOLN
INTRAMUSCULAR | Status: AC
  Filled 2020-12-13: qty 1

## 2020-12-13 MED ORDER — DEXAMETHASONE SODIUM PHOSPHATE 10 MG/ML IJ SOLN
INTRAMUSCULAR | Status: DC | PRN
  Administered 2020-12-13: 8 mg via INTRAVENOUS

## 2020-12-13 MED ORDER — LIDOCAINE 2% (20 MG/ML) 5 ML SYRINGE
INTRAMUSCULAR | Status: AC
  Filled 2020-12-13: qty 5

## 2020-12-13 MED ORDER — CHLORHEXIDINE GLUCONATE CLOTH 2 % EX PADS: 6.0000 | MEDICATED_PAD | Freq: Once | CUTANEOUS | Status: DC

## 2020-12-13 MED ORDER — ONDANSETRON 4 MG PO TBDP: 4.0000 mg | ORAL_TABLET | Freq: Four times a day (QID) | ORAL | Status: DC | PRN

## 2020-12-13 MED ORDER — ONDANSETRON HCL 4 MG/2ML IJ SOLN
INTRAMUSCULAR | Status: AC
  Filled 2020-12-13: qty 2

## 2020-12-13 MED ORDER — PROPOFOL 10 MG/ML IV BOLUS
INTRAVENOUS | Status: DC | PRN
  Administered 2020-12-13: 140 mg via INTRAVENOUS
  Administered 2020-12-13: 20 mg via INTRAVENOUS

## 2020-12-13 MED ORDER — LACTATED RINGERS IV SOLN: INTRAVENOUS | Status: DC

## 2020-12-13 MED ORDER — ONDANSETRON HCL 4 MG/2ML IJ SOLN
INTRAMUSCULAR | Status: DC | PRN
  Administered 2020-12-13: 4 mg via INTRAVENOUS

## 2020-12-13 MED ORDER — PHENYLEPHRINE 40 MCG/ML (10ML) SYRINGE FOR IV PUSH (FOR BLOOD PRESSURE SUPPORT)
PREFILLED_SYRINGE | INTRAVENOUS | Status: DC | PRN
  Administered 2020-12-13 (×2): 120 ug via INTRAVENOUS
  Administered 2020-12-13: 80 ug via INTRAVENOUS
  Administered 2020-12-13 (×2): 120 ug via INTRAVENOUS
  Administered 2020-12-13 (×2): 80 ug via INTRAVENOUS

## 2020-12-13 MED ORDER — ACETAMINOPHEN 10 MG/ML IV SOLN: 1000.0000 mg | Freq: Once | INTRAVENOUS | Status: DC | PRN

## 2020-12-13 MED ORDER — LEVOTHYROXINE SODIUM 100 MCG PO TABS: 100.0000 ug | ORAL_TABLET | Freq: Every day | ORAL | 2 refills | Status: DC

## 2020-12-13 MED ORDER — LIDOCAINE 2% (20 MG/ML) 5 ML SYRINGE
INTRAMUSCULAR | Status: DC | PRN
  Administered 2020-12-13: 80 mg via INTRAVENOUS

## 2020-12-13 MED ORDER — RISPERIDONE 1 MG PO TABS
3.0000 mg | ORAL_TABLET | Freq: Two times a day (BID) | ORAL | Status: DC
  Administered 2020-12-13 – 2020-12-14 (×2): 3 mg via ORAL
  Filled 2020-12-13: qty 1
  Filled 2020-12-13 (×2): qty 3

## 2020-12-13 MED ORDER — ROCURONIUM BROMIDE 10 MG/ML (PF) SYRINGE
PREFILLED_SYRINGE | INTRAVENOUS | Status: AC
  Filled 2020-12-13: qty 10

## 2020-12-13 MED ORDER — LORAZEPAM 1 MG PO TABS
1.0000 mg | ORAL_TABLET | Freq: Two times a day (BID) | ORAL | Status: DC
  Administered 2020-12-13 – 2020-12-14 (×2): 1 mg via ORAL
  Filled 2020-12-13 (×2): qty 1

## 2020-12-13 MED ORDER — CEFAZOLIN SODIUM-DEXTROSE 2-4 GM/100ML-% IV SOLN
2.0000 g | INTRAVENOUS | Status: AC
  Administered 2020-12-13: 2 g via INTRAVENOUS
  Filled 2020-12-13: qty 100

## 2020-12-13 MED ORDER — DIVALPROEX SODIUM ER 500 MG PO TB24
500.0000 mg | ORAL_TABLET | Freq: Every day | ORAL | Status: DC
  Administered 2020-12-14: 500 mg via ORAL
  Filled 2020-12-13: qty 1

## 2020-12-13 MED ORDER — PROPRANOLOL HCL 20 MG PO TABS
20.0000 mg | ORAL_TABLET | Freq: Two times a day (BID) | ORAL | Status: DC
  Administered 2020-12-13 – 2020-12-14 (×2): 20 mg via ORAL
  Filled 2020-12-13 (×2): qty 1

## 2020-12-13 MED ORDER — 0.9 % SODIUM CHLORIDE (POUR BTL) OPTIME
TOPICAL | Status: DC | PRN
  Administered 2020-12-13: 1000 mL

## 2020-12-13 MED ORDER — ACETAMINOPHEN 500 MG PO TABS: 1000.0000 mg | ORAL_TABLET | Freq: Once | ORAL | Status: DC | PRN

## 2020-12-13 MED ORDER — ORAL CARE MOUTH RINSE: 15.0000 mL | Freq: Once | OROMUCOSAL | Status: AC

## 2020-12-13 MED ORDER — OXYCODONE HCL 5 MG PO TABS
5.0000 mg | ORAL_TABLET | ORAL | Status: DC | PRN
  Administered 2020-12-13 – 2020-12-14 (×2): 10 mg via ORAL
  Filled 2020-12-13 (×2): qty 2

## 2020-12-13 MED ORDER — OXYCODONE HCL 5 MG/5ML PO SOLN: 5.0000 mg | Freq: Once | ORAL | Status: DC | PRN

## 2020-12-13 MED ORDER — OXYCODONE HCL 5 MG PO TABS: 5.0000 mg | ORAL_TABLET | Freq: Once | ORAL | Status: DC | PRN

## 2020-12-13 MED ORDER — TRAMADOL HCL 50 MG PO TABS
50.0000 mg | ORAL_TABLET | Freq: Four times a day (QID) | ORAL | Status: DC | PRN
  Administered 2020-12-14: 50 mg via ORAL
  Filled 2020-12-13: qty 1

## 2020-12-13 MED ORDER — ROCURONIUM BROMIDE 10 MG/ML (PF) SYRINGE
PREFILLED_SYRINGE | INTRAVENOUS | Status: DC | PRN
  Administered 2020-12-13: 10 mg via INTRAVENOUS
  Administered 2020-12-13: 70 mg via INTRAVENOUS

## 2020-12-13 MED ORDER — CHLORHEXIDINE GLUCONATE 0.12 % MT SOLN
15.0000 mL | Freq: Once | OROMUCOSAL | Status: AC
  Administered 2020-12-13: 15 mL via OROMUCOSAL

## 2020-12-13 MED ORDER — PHENYLEPHRINE HCL (PRESSORS) 10 MG/ML IV SOLN
INTRAVENOUS | Status: AC
  Filled 2020-12-13: qty 1

## 2020-12-13 MED ORDER — KETAMINE HCL 10 MG/ML IJ SOLN
INTRAMUSCULAR | Status: DC | PRN
  Administered 2020-12-13: 50 mg via INTRAVENOUS

## 2020-12-13 MED ORDER — FENTANYL CITRATE (PF) 100 MCG/2ML IJ SOLN: 25.0000 ug | INTRAMUSCULAR | Status: DC | PRN

## 2020-12-13 MED ORDER — MIDAZOLAM HCL 5 MG/5ML IJ SOLN
INTRAMUSCULAR | Status: DC | PRN
  Administered 2020-12-13: 2 mg via INTRAVENOUS

## 2020-12-13 MED ORDER — FENTANYL CITRATE (PF) 250 MCG/5ML IJ SOLN
INTRAMUSCULAR | Status: AC
  Filled 2020-12-13: qty 5

## 2020-12-13 MED ORDER — PROPOFOL 10 MG/ML IV BOLUS
INTRAVENOUS | Status: AC
  Filled 2020-12-13: qty 20

## 2020-12-13 MED ORDER — FENTANYL CITRATE (PF) 100 MCG/2ML IJ SOLN
INTRAMUSCULAR | Status: DC | PRN
  Administered 2020-12-13: 100 ug via INTRAVENOUS
  Administered 2020-12-13 (×2): 50 ug via INTRAVENOUS

## 2020-12-13 MED ORDER — ACETAMINOPHEN 325 MG PO TABS: 650.0000 mg | ORAL_TABLET | Freq: Four times a day (QID) | ORAL | Status: DC | PRN

## 2020-12-13 SURGICAL SUPPLY — 31 items

## 2020-12-13 NOTE — Transfer of Care (Signed)
Immediate Anesthesia Transfer of Care Note  Patient: Geoffrey White  Procedure(s) Performed: TOTAL THYROIDECTOMY (N/A Neck)  Patient Location: PACU  Anesthesia Type:General  Level of Consciousness: drowsy and patient cooperative  Airway & Oxygen Therapy: Patient Spontanous Breathing and Patient connected to face mask oxygen  Post-op Assessment: Report given to RN and Post -op Vital signs reviewed and stable  Post vital signs: Reviewed and stable  Last Vitals:  Vitals Value Taken Time  BP 135/96 12/13/20 1619  Temp    Pulse 70 12/13/20 1625  Resp    SpO2 100 % 12/13/20 1625  Vitals shown include unvalidated device data.  Last Pain:  Vitals:   12/13/20 1203  TempSrc:   PainSc: 0-No pain         Complications: No complications documented.

## 2020-12-13 NOTE — Anesthesia Procedure Notes (Signed)
Procedure Name: Intubation Date/Time: 12/13/2020 1:51 PM Performed by: Montel Clock, CRNA Pre-anesthesia Checklist: Patient identified, Emergency Drugs available, Suction available, Patient being monitored and Timeout performed Patient Re-evaluated:Patient Re-evaluated prior to induction Oxygen Delivery Method: Circle system utilized Preoxygenation: Pre-oxygenation with 100% oxygen Induction Type: IV induction Ventilation: Mask ventilation without difficulty Laryngoscope Size: Mac and 3 Grade View: Grade II Tube type: Oral Tube size: 7.5 mm Number of attempts: 1 Airway Equipment and Method: Stylet Placement Confirmation: ETT inserted through vocal cords under direct vision,  positive ETCO2 and breath sounds checked- equal and bilateral Secured at: 23 cm Tube secured with: Tape Dental Injury: Teeth and Oropharynx as per pre-operative assessment

## 2020-12-13 NOTE — Discharge Instructions (Signed)
[facility]  THYROID & PARATHYROID SURGERY:  POST-OP INSTRUCTIONS  Always review your discharge instruction sheet from the facility where your surgery was performed.  A prescription for pain medication may be given to you upon discharge.  Take your pain medication as prescribed.  If narcotic pain medicine is not needed, then you may take acetaminophen (Tylenol) or ibuprofen (Advil) as needed.  Take your usually prescribed medications unless otherwise directed.  If you need a refill on your pain medication, please contact our office during regular business hours.  Prescriptions cannot be processed by our office after 5 pm or on weekends.  Start with a light diet upon arrival home, such as soup and crackers or toast.  Be sure to drink plenty of fluids daily.  Resume your normal diet the day after surgery.  Most patients will experience some swelling and bruising on the chest and neck area.  Ice packs will help.  Swelling and bruising can take several days to resolve.   It is common to experience some constipation after surgery.  Increasing fluid intake and taking a stool softener (Colace) will usually help or prevent this problem.  A mild laxative (Milk of Magnesia or Miralax) should be taken according to package directions if there has been no bowel movement after 48 hours.  You have steri-strips and a gauze dressing over your incision.  You may remove the gauze bandage on the second day after surgery, and you may shower at that time.  Leave your steri-strips (small skin tapes) in place directly over the incision.  These strips should remain on the skin for 5-7 days and then be removed.  You may get them wet in the shower and pat them dry.  You may resume regular (light) daily activities beginning the next day (such as daily self-care, walking, climbing stairs) gradually increasing activities as tolerated.  You may have sexual intercourse when it is comfortable.  Refrain from  any heavy lifting or straining until approved by your doctor.  You may drive when you no longer are taking prescription pain medication, you can comfortably wear a seatbelt, and you can safely maneuver your car and apply brakes.  You should see your doctor in the office for a follow-up appointment approximately three weeks after your surgery.  Make sure that you call for this appointment within a day or two after you arrive home to insure a convenient appointment time.  WHEN TO CALL YOUR DOCTOR: -- Fever greater than 101.5 -- Inability to urinate -- Nausea and/or vomiting - persistent -- Extreme swelling or bruising -- Continued bleeding from incision -- Increased pain, redness, or drainage from the incision -- Difficulty swallowing or breathing -- Muscle cramping or spasms -- Numbness or tingling in hands or around lips  The clinic staff is available to answer your questions during regular business hours.  Please don't hesitate to call and ask to speak to one of the nurses if you have concerns.  Yomara Toothman, MD [facility] Office: [phone] 

## 2020-12-13 NOTE — Progress Notes (Signed)
Patient is autistic and resides in a group home.  Caretaker [name] was able to confirm some health/medical related questions as the patient could not.   Patient is able to state his name and DOB and he knows he is here for surgery on his neck area with Dr.Gerkin.  Patient verbally stated his surgery type.  Patients mother, [name], is at the bedside now, she is gonna sit with patient until he goes back to the OR.  The mother is gonna be in the waiting room. If medical/health info.is needed Geoffrey White's number is: [phone], he is patients caretaker at the group home.

## 2020-12-13 NOTE — Anesthesia Preprocedure Evaluation (Signed)
Anesthesia Evaluation  Patient identified by MRN, date of birth, ID band Patient awake    Reviewed: Allergy & Precautions, NPO status , Patient's Chart, lab work & pertinent test results  Airway Mallampati: I  TM Distance: >3 FB Neck ROM: Full Positive for:  Tracheal deviation   Dental  (+) Dental Advisory Given, Teeth Intact   Pulmonary neg pulmonary ROS,  Covid-19 Nucleic Acid Test Results Lab Results      Component                Value               Date                      [place]              NEGATIVE            12/10/2020                [place]              Not Detected        06/06/2019              breath sounds clear to auscultation       Cardiovascular negative cardio ROS   Rhythm:Regular     Neuro/Psych autism negative psych ROS   GI/Hepatic negative GI ROS, Neg liver ROS,   Endo/Other  negative endocrine ROS  Renal/GU negative Renal ROS     Musculoskeletal negative musculoskeletal ROS (+)   Abdominal   Peds  Hematology negative hematology ROS (+)   Anesthesia Other Findings Pharynx and larynx: 2 low-density polyps are present in the posterior nasal passageway nasopharynx. Each of these measures approximately 10 x 15 mm. In addition, there is obstruction of the right maxillary sinus which could be due to a mass or polyp. Direct visualization recommended.  Remainder of the pharynx is normal. Larynx normal. Trachea displaced to the left due to large right thyroid mass.  Salivary glands: No inflammation, mass, or stone.  Thyroid: Large mass in the right lobe of the thyroid measuring approximately 5.3 x 4.6 x 6.4 cm. There is heterogeneous enhancement of the mass which appears solid and consistent with known tumor. Prominent draining vein superior to the mass. Trachea is displaced to the left. Left lobe of the thyroid normal without nodule  Lymph nodes: No enlarged or pathologic lymph  nodes in the neck.  Vascular: Normal vascular enhancement. Jugular vein patent bilaterally.  Limited intracranial: Negative  Visualized orbits: Negative orbit.  Bilateral cataract extraction  Mastoids and visualized paranasal sinuses: Complete opacification right maxillary sinus. This appears obstructed due to a lesion in the nasal passageway. Possible polyp or mass. Direct visualization recommended. Minimal mucosal edema left maxillary sinus. Remaining sinuses clear.  Skeleton: No acute skeletal abnormality  Moderately large right-sided disc protrusion at C3-4 with associated spurring and significant right foraminal encroachment. Correlate with radiculopathy.  Upper chest: Lung apices clear bilaterally.  Reproductive/Obstetrics                             Anesthesia Physical Anesthesia Plan  ASA: II  Anesthesia Plan: General   Post-op Pain Management:    Induction: Intravenous and Rapid sequence  PONV Risk Score and Plan: 2 and Ondansetron and Dexamethasone  Airway Management Planned: Oral ETT  Additional Equipment: None  Intra-op Plan:   Post-operative Plan: Extubation  in OR  Informed Consent: I have reviewed the patients History and Physical, chart, labs and discussed the procedure including the risks, benefits and alternatives for the proposed anesthesia with the patient or authorized representative who has indicated his/her understanding and acceptance.     Dental advisory given and Consent reviewed with POA  Plan Discussed with: CRNA and Surgeon  Anesthesia Plan Comments:         Anesthesia Quick Evaluation

## 2020-12-13 NOTE — Op Note (Signed)
Procedure Note  Pre-operative Diagnosis:  Hurthle cell neoplasm, multiple thyroid nodules  Post-operative Diagnosis:  same  Surgeon:  Armandina Gemma, MD  Assistant:  none   Procedure:  Total thyroidectomy  Anesthesia:  General  Estimated Blood Loss:  minimal  Drains: none         Specimen: thyroid to pathology  Indications:  Patient is referred by Dr. Derek Jack for surgical evaluation and management of a Hurthle cell neoplasm of the thyroid. Patient's primary care physician is Dr. Sherrie Sport. Patient was noted approximately one month ago to have a mass in the right neck by his mother. Patient underwent evaluation in even, [place], including an ultrasound examination showing bilateral thyroid nodules with a greater than 7 cm mass in the right thyroid lobe. Fine-needle aspiration biopsy was performed on each nodule. The dominant nodule in the right lobe demonstrated Hurthle cell changes, Bethesda category IV. The nodule in the left lobe provided insufficient material for diagnosis, Bethesda category I. Patient has developed some mild compressive symptoms. He is had no prior head or neck surgery. There are no immediate family members with any history of thyroid neoplasm or other endocrine neoplasm. Patient is accompanied today by his mother and father. Patient underwent CT scan of the neck on October 16, 2020. This demonstrated a 5.3 x 4.6 x 6.4 cm mass in the right thyroid lobe with heterogeneous enhancement. There was no significant lymphadenopathy identified. Patient lives in a group home. Patient now comes to surgery for total thyroidectomy.  Procedure Details: Procedure was done in OR #1 at the [facility]. The patient was brought to the operating room and placed in a supine position on the operating room table. Following administration of general anesthesia, the patient was positioned and then prepped and draped in the usual aseptic fashion. After ascertaining  that an adequate level of anesthesia had been achieved, a small Kocher incision was made with #15 blade. Dissection was carried through subcutaneous tissues and platysma.Hemostasis was achieved with the electrocautery. Skin flaps were elevated cephalad and caudad from the thyroid notch to the sternal notch. A Mahorner self-retaining retractor was placed for exposure. Strap muscles were incised in the midline and dissection was begun on the left side.  Strap muscles were reflected laterally.  Left thyroid lobe was normal in size with small nodules palpable.  The left lobe was gently mobilized with blunt dissection. Superior pole vessels were dissected out and divided individually between small and medium ligaclips with the harmonic scalpel. The thyroid lobe was rolled anteriorly. Branches of the inferior thyroid artery were divided between small ligaclips with the harmonic scalpel. Inferior venous tributaries were divided between ligaclips. The recurrent laryngeal nerve was identified and preserved along its course. The ligament of Gwenlyn Found was released with the electrocautery and the gland was mobilized onto the anterior trachea. Isthmus was mobilized across the midline. There was no pyramidal lobe present. Dry pack was placed in the left neck.  The right thyroid lobe was gently mobilized with blunt dissection. Right thyroid lobe was markedly enlarged with a dominant mass occupying most of the lobe. Superior pole vessels were dissected out and divided between small and medium ligaclips with the Harmonic scalpel. Superior parathyroid was identified and preserved. Inferior venous tributaries were divided between medium ligaclips with the harmonic scalpel. The right thyroid lobe was rolled anteriorly and the branches of the inferior thyroid artery divided between small ligaclips. The right recurrent laryngeal nerve was identified it coursed along the lateral aspect of  the large mass in the right lobe.  It was carefully  dissected off of the surface of the mass mobilized laterally and preserved along its course. The ligament of Gwenlyn Found was released with the electrocautery. The right thyroid lobe was mobilized onto the anterior trachea and the remainder of the thyroid was dissected off the anterior trachea and the thyroid was completely excised. A suture was used to mark the left lobe. The entire thyroid gland was submitted to pathology for review.  The neck was irrigated with warm saline. Fibrillar was placed throughout the operative field. Strap muscles were approximated in the midline with interrupted 3-0 Vicryl sutures. Platysma was closed with interrupted 3-0 Vicryl sutures. Skin was closed with a running 4-0 Monocryl subcuticular suture. Wound was washed and Dermabond was applied. The patient was awakened from anesthesia and brought to the recovery room. The patient tolerated the procedure well.   Armandina Gemma, MD [facility] Office: [phone]

## 2020-12-13 NOTE — Interval H&P Note (Signed)
History and Physical Interval Note:  12/13/2020 1:36 PM  Geoffrey White  has presented today for surgery, with the diagnosis of total thyroidectomy.  The various methods of treatment have been discussed with the patient and family. After consideration of risks, benefits and other options for treatment, the patient has consented to    Procedure(s): TOTAL THYROIDECTOMY (N/A) as a surgical intervention.    The patient's history has been reviewed, patient examined, no change in status, stable for surgery.  I have reviewed the patient's chart and labs.  Questions were answered to the patient's satisfaction.    Armandina Gemma, MD [facility] Office: [place]

## 2020-12-14 DIAGNOSIS — D34 Benign neoplasm of thyroid gland: Secondary | ICD-10-CM | POA: Diagnosis not present

## 2020-12-14 LAB — BASIC METABOLIC PANEL
Anion gap: 9 (ref 5–15)
BUN: 12 mg/dL (ref 6–20)
CO2: 27 mmol/L (ref 22–32)
Calcium: 9 mg/dL (ref 8.9–10.3)
Chloride: 101 mmol/L (ref 98–111)
Creatinine, Ser: 1.09 mg/dL (ref 0.61–1.24)
GFR, Estimated: >60 mL/min (ref >60)
Glucose, Bld: 144 mg/dL — ABNORMAL HIGH (ref 70–99)
Potassium: 4.7 mmol/L (ref 3.5–5.1)
Sodium: 137 mmol/L (ref 135–145)

## 2020-12-14 MED ORDER — LEVOTHYROXINE SODIUM 100 MCG PO TABS: 100.0000 ug | ORAL_TABLET | Freq: Every day | ORAL | 2 refills | Status: DC

## 2020-12-14 MED ORDER — CALCIUM CARBONATE ANTACID 500 MG PO CHEW: 2.0000 | CHEWABLE_TABLET | Freq: Three times a day (TID) | ORAL | 1 refills | Status: DC

## 2020-12-14 MED ORDER — TRAMADOL HCL 50 MG PO TABS: 50.0000 mg | ORAL_TABLET | Freq: Four times a day (QID) | ORAL | 0 refills | Status: AC | PRN

## 2020-12-14 NOTE — Discharge Summary (Signed)
Physician Discharge Summary    Patient ID: Geoffrey White MRN: [id number] DOB/AGE: 09/15/1975  45 y.o.  Patient Care Team: Neale Burly, MD as PCP - General (Internal Medicine) Brien Mates, RN as Oncology Nurse Navigator (Oncology)  Admit date: 12/13/2020  Discharge date: 12/14/2020  Hospital Stay = 0 days    Discharge Diagnoses:  Principal Problem:   Neoplasm of uncertain behavior of thyroid gland Active Problems:   Hurthle cell tumor   Multiple thyroid nodules   1 Day Post-Op  12/13/2020  POST-OPERATIVE DIAGNOSIS:   total thyroidectomy  SURGERY:  TOTAL THYROIDECTOMY  SURGEON:    Surgeon(s): Armandina Gemma, MD  Consults: None  Hospital Course:   The patient underwent the surgery above.  Postoperatively, the patient gradually mobilized and advanced to a solid diet.  Pain and other symptoms were treated aggressively.    By the time of discharge, the patient was walking well the hallways, eating food, having flatus.  Mild hoarseness.  Pain was well-controlled on an oral medications.  Based on meeting discharge criteria and continuing to recover, I felt it was safe for the patient to be discharged from the hospital to further recover with close followup. Postoperative recommendations were discussed in detail to the patient and especially his mother at bedside.  They are written as well.  Discharged Condition: good  Discharge Exam: Blood pressure 136/68, pulse 91, temperature (!) 97.5 F (36.4 C), temperature source Oral, resp. rate 18, height 6\' 1"  (1.854 m), weight 90.9 kg, SpO2 100 %.  General: Pt awake/alert/oriented x4 in No acute distress.  Mother at bedside Eyes: PERRL, normal EOM.  Sclera clear.  No icterus Neuro: CN II-XII intact w/o focal sensory/motor deficits. Lymph: No head/neck/groin lymphadenopathy Psych:  No delerium/psychosis/paranoia HENT: Normocephalic, Mucus membranes moist.  No thrush  Neck: Supple, No tracheal deviation.  Incision  with normal healing ridge.  Minimal hoarseness.  No stridor.  Tolerating solid food and swallowing liquids without any dysphagia or difficulty.  Chest: No chest wall pain w good excursion CV:  Pulses intact.  Regular rhythm MS: Normal AROM mjr joints.  No obvious deformity Abdomen: Soft.  Nondistended.  Nontender.  No evidence of peritonitis.  No incarcerated hernias. Ext:  SCDs BLE.  No mjr edema.  No cyanosis Skin: No petechiae / purpura   Disposition:    Follow-up Information    Armandina Gemma, MD. Schedule an appointment as soon as possible for a visit in 3 weeks.   Specialty: General Surgery Why: For wound re-check Contact information: [address] [place] [address] [phone]               Discharge disposition: 01-Home or Self Care       Discharge Instructions    Call MD for:   Complete by: As directed    FEVER > 101.5 F  (temperatures < 101.5 F are not significant)   Call MD for:  extreme fatigue   Complete by: As directed    Call MD for:  persistant dizziness or light-headedness   Complete by: As directed    Call MD for:  persistant nausea and vomiting   Complete by: As directed    Call MD for:  redness, tenderness, or signs of infection (pain, swelling, redness, odor or green/yellow discharge around incision site)   Complete by: As directed    Call MD for:  severe uncontrolled pain   Complete by: As directed    Diet - low sodium heart healthy  Complete by: As directed    Start with a bland diet such as soups, liquids, starchy foods, low fat foods, etc. the first few days at home. Gradually advance to a solid, low-fat, high fiber diet by the end of the first week at home.   Add a fiber supplement to your diet (Metamucil, etc) If you feel full, bloated, or constipated, stay on a full liquid or pureed/blenderized diet for a few days until you feel better and are no longer constipated.   Discharge instructions   Complete by: As  directed    See Discharge Instructions If you are not getting better after two weeks or are noticing you are getting worse, contact our office [phone] for further advice.  We may need to adjust your medications, re-evaluate you in the office, send you to the emergency room, or see what other things we can do to help. The clinic staff is available to answer your questions during regular business hours (8:30am-5pm).  Please don't hesitate to call and ask to speak to one of our nurses for clinical concerns.    A surgeon from [facility] is always on call at the hospitals 24 hours/day If you have a medical emergency, go to the nearest emergency room or call 911.   Discharge wound care:   Complete by: As directed    It is good for closed incisions and even open wounds to be washed every day.  Shower every day.  Short baths are fine.  Wash the incisions and wounds clean with soap & water.    You may leave closed incisions open to air if it is dry.   You may cover the incision with clean gauze & replace it after your daily shower for comfort.  DERMABOND:  You have purple skin glue (Dermabond) on your incision(s).  Leave them in place, and they will fall off on their own like a scab in 2-3 weeks.  You may trim any edges that curl up with clean scissors.   Driving Restrictions   Complete by: As directed    You may drive when: - you are no longer taking narcotic prescription pain medication - you can comfortably wear a seatbelt - you can safely make sudden turns/stops without pain.   Increase activity slowly   Complete by: As directed    Start light daily activities --- self-care, walking, climbing stairs- beginning the day after surgery.  Gradually increase activities as tolerated.  Control your pain to be active.  Stop when you are tired.  Ideally, walk several times a day, eventually an hour a day.   Most people are back to most day-to-day activities in a few weeks.  It takes 4-6  weeks to get back to unrestricted, intense activity. If you can walk 30 minutes without difficulty, it is safe to try more intense activity such as jogging, treadmill, bicycling, low-impact aerobics, swimming, etc. Save the most intensive and strenuous activity for last (Usually 4-8 weeks after surgery) such as sit-ups, heavy lifting, contact sports, etc.  Refrain from any intense heavy lifting or straining until you are off narcotics for pain control.  You will have off days, but things should improve week-by-week. DO NOT PUSH THROUGH PAIN.  Let pain be your guide: If it hurts to do something, don't do it.   Lifting restrictions   Complete by: As directed    If you can walk 30 minutes without difficulty, it is safe to try more intense activity such  as jogging, treadmill, bicycling, low-impact aerobics, swimming, etc. Save the most intensive and strenuous activity for last (Usually 4-8 weeks after surgery) such as sit-ups, heavy lifting, contact sports, etc.   Refrain from any intense heavy lifting or straining until you are off narcotics for pain control.  You will have off days, but things should improve week-by-week. DO NOT PUSH THROUGH PAIN.  Let pain be your guide: If it hurts to do something, don't do it.  Pain is your body warning you to avoid that activity for another week until the pain goes down.   May shower / Bathe   Complete by: As directed    May walk up steps   Complete by: As directed    Sexual Activity Restrictions   Complete by: As directed    You may have sexual intercourse when it is comfortable. If it hurts to do something, stop.      Allergies as of 12/14/2020   No Known Allergies     Medication List    TAKE these medications   benztropine 1 MG tablet Commonly known as: COGENTIN Take 1 mg by mouth in the morning, at noon, and at bedtime.   calcium carbonate 500 MG chewable tablet Commonly known as: Tums Chew 2 tablets (400 mg of elemental calcium total) by mouth  3 (three) times daily.   divalproex 500 MG 24 hr tablet Commonly known as: DEPAKOTE ER Take 500 mg by mouth in the morning and at bedtime.   ergocalciferol 1.25 MG (50000 UT) capsule Commonly known as: VITAMIN D2 Take 50,000 Units by mouth every Thursday.   Fish Oil 1000 MG Caps Take 1 capsule by mouth in the morning, at noon, and at bedtime.   haloperidol 5 MG tablet Commonly known as: HALDOL Take 5 mg by mouth 3 (three) times daily.   levocetirizine 5 MG tablet Commonly known as: XYZAL Take 5 mg by mouth every morning.   levothyroxine 100 MCG tablet Commonly known as: Synthroid Take 1 tablet (100 mcg total) by mouth daily before breakfast.   LORazepam 1 MG tablet Commonly known as: ATIVAN Take 1 mg by mouth 2 (two) times daily.   propranolol 20 MG tablet Commonly known as: INDERAL Take 20 mg by mouth 2 (two) times daily.   risperiDONE 3 MG tablet Commonly known as: RISPERDAL Take 3 mg by mouth 2 (two) times daily.   traMADol 50 MG tablet Commonly known as: ULTRAM Take 1-2 tablets (50-100 mg total) by mouth every 6 (six) hours as needed for moderate pain.            Discharge Care Instructions  (From admission, onward)         Start     Ordered   12/14/20 0000  Discharge wound care:       Comments: It is good for closed incisions and even open wounds to be washed every day.  Shower every day.  Short baths are fine.  Wash the incisions and wounds clean with soap & water.    You may leave closed incisions open to air if it is dry.   You may cover the incision with clean gauze & replace it after your daily shower for comfort.  DERMABOND:  You have purple skin glue (Dermabond) on your incision(s).  Leave them in place, and they will fall off on their own like a scab in 2-3 weeks.  You may trim any edges that curl up with clean scissors.   12/14/20 [id number]  Significant Diagnostic Studies:  Results for orders placed or performed during the hospital  encounter of 12/13/20 (from the past 72 hour(s))  ABO/Rh     Status: None   Collection Time: 12/13/20  5:00 AM  Result Value Ref Range   ABO/RH(D)      B NEG Performed at [facility] [address] [place], [place] [address]   Basic metabolic panel     Status: Abnormal   Collection Time: 12/14/20  3:45 AM  Result Value Ref Range   Sodium 137 135 - 145 mmol/L   Potassium 4.7 3.5 - 5.1 mmol/L   Chloride 101 98 - 111 mmol/L   CO2 27 22 - 32 mmol/L   Glucose, Bld 144 (H) 70 - 99 mg/dL    Comment: Glucose reference range applies only to samples taken after fasting for at least 8 hours.   BUN 12 6 - 20 mg/dL   Creatinine, Ser 1.09 0.61 - 1.24 mg/dL   Calcium 9.0 8.9 - 10.3 mg/dL   GFR, Estimated >60 >60 mL/min    Comment: (NOTE) Calculated using the CKD-EPI Creatinine Equation (2021)    Anion gap 9 5 - 15    Comment: Performed at [facility], [place] [address] [place], [place] [address]    No results found.  Past Medical History:  Diagnosis Date  . Autism     History reviewed. No pertinent surgical history.  Social History   Socioeconomic History  . Marital status: Single    Spouse name: Not on file  . Number of children: Not on file  . Years of education: Not on file  . Highest education level: Not on file  Occupational History  . Not on file  Tobacco Use  . Smoking status: Never Smoker  . Smokeless tobacco: Never Used  Vaping Use  . Vaping Use: Never used  Substance and Sexual Activity  . Alcohol use: No  . Drug use: No  . Sexual activity: Not on file  Other Topics Concern  . Not on file  Social History Narrative  . Not on file   Social Determinants of Health   Financial Resource Strain: Not on file  Food Insecurity: Not on file  Transportation Needs: Not on file  Physical Activity: Not on file  Stress: Not on file  Social Connections: Not on file  Intimate Partner Violence: Not on file    History reviewed. No  pertinent family history.  Current Facility-Administered Medications  Medication Dose Route Frequency Provider Last Rate Last Admin  . 0.45 % sodium chloride infusion   Intravenous Continuous Armandina Gemma, MD 50 mL/hr at 12/13/20 1728 New Bag at 12/13/20 1728  . acetaminophen (TYLENOL) tablet 650 mg  650 mg Oral Q6H PRN Armandina Gemma, MD       Or  . acetaminophen (TYLENOL) suppository 650 mg  650 mg Rectal Q6H PRN Armandina Gemma, MD      . benztropine (COGENTIN) tablet 1 mg  1 mg Oral Daily Armandina Gemma, MD   1 mg at 12/14/20 0949  . calcium carbonate (OS-CAL - dosed in mg of elemental calcium) tablet 1,000 mg of elemental calcium  2 tablet Oral TID WC Armandina Gemma, MD   1,000 mg of elemental calcium at 12/14/20 0947  . divalproex (DEPAKOTE ER) 24 hr tablet 500 mg  500 mg Oral Daily Armandina Gemma, MD   500 mg at 12/14/20 0949  . haloperidol (HALDOL) tablet 5 mg  5 mg Oral TID  Armandina Gemma, MD   5 mg at 12/14/20 6759  . HYDROmorphone (DILAUDID) injection 1 mg  1 mg Intravenous Q2H PRN Armandina Gemma, MD      . lactated ringers infusion   Intravenous Continuous Merlinda Frederick, MD 10 mL/hr at 12/13/20 1340 New Bag at 12/13/20 1503  . LORazepam (ATIVAN) tablet 1 mg  1 mg Oral BID Armandina Gemma, MD   1 mg at 12/14/20 0948  . ondansetron (ZOFRAN-ODT) disintegrating tablet 4 mg  4 mg Oral Q6H PRN Armandina Gemma, MD       Or  . ondansetron (ZOFRAN) injection 4 mg  4 mg Intravenous Q6H PRN Armandina Gemma, MD      . oxyCODONE (Oxy IR/ROXICODONE) immediate release tablet 5-10 mg  5-10 mg Oral Q4H PRN Armandina Gemma, MD   10 mg at 12/14/20 1638  . propranolol (INDERAL) tablet 20 mg  20 mg Oral BID Armandina Gemma, MD   20 mg at 12/14/20 0948  . risperiDONE (RISPERDAL) tablet 3 mg  3 mg Oral BID Armandina Gemma, MD   3 mg at 12/14/20 0948  . traMADol (ULTRAM) tablet 50 mg  50 mg Oral Q6H PRN Armandina Gemma, MD   50 mg at 12/14/20 0024     No Known Allergies  Signed: Morton Peters, MD, FACS,  MASCRS  Esophageal, Gastrointestinal & Colorectal Surgery Robotic and Minimally Invasive Surgery [facility] [address], [place], [place] [address] [phone] Fax [phone] Main/Paging  CONTACT INFORMATION: Weekday (9AM-5PM) concerns: Call [facility] main office at [phone] Weeknight (5PM-9AM) or Weekend/Holiday concerns: Check [email or web address] for General Surgery [facility] coverage (Please, do not use SecureChat as it is not reliable communication to operating surgeons for immediate patient care)      12/14/2020, 9:59 AM

## 2020-12-14 NOTE — Progress Notes (Signed)
Reviewed written d/c instructions w pt's mother/father and all questions answered. They verbalized understanding. D/C via w/c w family w all belongings in stable condition.

## 2020-12-16 ENCOUNTER — Encounter (HOSPITAL_COMMUNITY): Payer: Self-pay | Admitting: Surgery

## 2020-12-17 NOTE — Anesthesia Postprocedure Evaluation (Signed)
Anesthesia Post Note  Patient: Geoffrey White  Procedure(s) Performed: TOTAL THYROIDECTOMY (N/A Neck)     Patient location during evaluation: PACU Anesthesia Type: General Level of consciousness: awake and alert Pain management: pain level controlled Vital Signs Assessment: post-procedure vital signs reviewed and stable Respiratory status: spontaneous breathing, nonlabored ventilation, respiratory function stable and patient connected to nasal cannula oxygen Cardiovascular status: blood pressure returned to baseline and stable Postop Assessment: no apparent nausea or vomiting Anesthetic complications: no   No complications documented.  Last Vitals:  Vitals:   12/14/20 0440 12/14/20 0949  BP: 120/71 136/68  Pulse: 96 91  Resp: 15 18  Temp: (!) 36.4 C (!) 36.4 C  SpO2: 100% 100%    Last Pain:  Vitals:   12/14/20 1300  TempSrc:   PainSc: Asleep                 Mariateresa Batra

## 2020-12-18 LAB — SURGICAL PATHOLOGY

## 2021-01-10 ENCOUNTER — Other Ambulatory Visit: Payer: Self-pay

## 2021-01-10 ENCOUNTER — Inpatient Hospital Stay (HOSPITAL_COMMUNITY): Payer: Medicare Other | Attending: Hematology

## 2021-01-10 DIAGNOSIS — C73 Malignant neoplasm of thyroid gland: Secondary | ICD-10-CM | POA: Diagnosis not present

## 2021-01-10 DIAGNOSIS — D34 Benign neoplasm of thyroid gland: Secondary | ICD-10-CM

## 2021-01-10 LAB — TSH: TSH: 31.786 u[IU]/mL — ABNORMAL HIGH (ref 0.350–4.500)

## 2021-01-10 LAB — T4, FREE: Free T4: 0.91 ng/dL (ref 0.61–1.12)

## 2021-01-11 LAB — THYROGLOBULIN ANTIBODY: Thyroglobulin Antibody: <1 IU/mL (ref 0.0–0.9)

## 2021-01-16 ENCOUNTER — Ambulatory Visit (HOSPITAL_COMMUNITY): Payer: Medicaid Other | Admitting: Hematology

## 2021-01-16 DIAGNOSIS — D34 Benign neoplasm of thyroid gland: Secondary | ICD-10-CM

## 2021-01-20 LAB — THYROGLOBULIN LEVEL: Thyroglobulin: <2 ng/mL

## 2021-01-22 ENCOUNTER — Other Ambulatory Visit: Payer: Self-pay

## 2021-01-22 ENCOUNTER — Inpatient Hospital Stay (HOSPITAL_COMMUNITY): Payer: Medicare Other | Admitting: Hematology and Oncology

## 2021-01-23 NOTE — Progress Notes (Signed)
[facility] [place], [place] [address]   CLINIC:  Medical Oncology/Hematology  PCP:  Neale Burly, MD [facility] / [place] [address] [phone]   REASON FOR VISIT:  Follow-up for Hurthle cell lesion  PRIOR THERAPY: none  NGS Results: not done  CURRENT THERAPY: Surveillance  BRIEF ONCOLOGIC HISTORY:  Oncology History   No history exists.    CANCER STAGING: Cancer Staging No matching staging information was found for the patient.  INTERVAL HISTORY:  Geoffrey White, a 45 y.o. 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 Geoffrey White was last seen on 12/05/2020.   Today Geoffrey White reports feeling well. His energy levels are 60% from baseline, and Geoffrey White reports feeling cold constantly. Geoffrey White had a total thyroidectomy 12/13/2020.   REVIEW OF SYSTEMS:  Review of Systems  Constitutional:  Positive for fatigue (80%). Negative for appetite change.  All other systems reviewed and are negative.  PAST MEDICAL/SURGICAL HISTORY:  Past Medical History:  Diagnosis Date   Autism    Past Surgical History:  Procedure Laterality Date   THYROIDECTOMY N/A 12/13/2020   Procedure: TOTAL THYROIDECTOMY;  Surgeon: Armandina Gemma, MD;  Location: [facility];  Service: General;  Laterality: N/A;    SOCIAL HISTORY:  Social History   Socioeconomic History   Marital status: Single    Spouse name: Not on file   Number of children: Not on file   Years of education: Not on file   Highest education level: Not on file  Occupational History   Not on file  Tobacco Use   Smoking status: Never   Smokeless tobacco: Never  Vaping Use   Vaping Use: Never used  Substance and Sexual Activity   Alcohol use: No   Drug use: No   Sexual activity: Not on file  Other Topics Concern   Not on file  Social History Narrative   Not on file   Social Determinants of Health   Financial Resource Strain: Not on file  Food Insecurity: Not on file   Transportation Needs: Not on file  Physical Activity: Not on file  Stress: Not on file  Social Connections: Not on file  Intimate Partner Violence: Not on file    FAMILY HISTORY:  No family history on file.  CURRENT MEDICATIONS:  Current Outpatient Medications  Medication Sig Dispense Refill   benztropine (COGENTIN) 1 MG tablet Take 1 mg by mouth in the morning, at noon, and at bedtime.     calcium carbonate (TUMS) 500 MG chewable tablet Chew 2 tablets (400 mg of elemental calcium total) by mouth 3 (three) times daily. 90 tablet 1   divalproex (DEPAKOTE ER) 500 MG 24 hr tablet Take 500 mg by mouth in the morning and at bedtime.     ergocalciferol (VITAMIN D2) 1.25 MG (50000 UT) capsule Take 50,000 Units by mouth every Thursday.     haloperidol (HALDOL) 5 MG tablet Take 5 mg by mouth 3 (three) times daily.     levocetirizine (XYZAL) 5 MG tablet Take 5 mg by mouth every morning.     levothyroxine (SYNTHROID) 100 MCG tablet Take 1 tablet (100 mcg total) by mouth daily before breakfast. 30 tablet 2   LORazepam (ATIVAN) 1 MG tablet Take 1 mg by mouth 2 (two) times daily.     Omega-3 Fatty Acids (FISH OIL) 1000 MG CAPS Take 1 capsule by mouth in the morning, at noon, and at bedtime.  propranolol (INDERAL) 20 MG tablet Take 20 mg by mouth 2 (two) times daily.     risperiDONE (RISPERDAL) 3 MG tablet Take 3 mg by mouth 2 (two) times daily.     traMADol (ULTRAM) 50 MG tablet Take 1-2 tablets (50-100 mg total) by mouth every 6 (six) hours as needed for moderate pain. 15 tablet 0   No current facility-administered medications for this visit.    ALLERGIES:  No Known Allergies  PHYSICAL EXAM:  Performance status (ECOG): 0 - Asymptomatic  There were no vitals filed for this visit. Wt Readings from Last 3 Encounters:  12/13/20 200 lb 6.4 oz (90.9 kg)  12/05/20 200 lb 4.8 oz (90.9 kg)  10/17/20 188 lb 6.4 oz (85.5 kg)   Physical Exam Vitals reviewed.  Constitutional:      Appearance:  Normal appearance.  Cardiovascular:     Rate and Rhythm: Normal rate and regular rhythm.     Pulses: Normal pulses.     Heart sounds: Normal heart sounds.  Pulmonary:     Effort: Pulmonary effort is normal.     Breath sounds: Normal breath sounds.  Neurological:     General: No focal deficit present.     Mental Status: Geoffrey White is alert and oriented to person, place, and time.  Psychiatric:        Mood and Affect: Mood normal.        Behavior: Behavior normal.     LABORATORY DATA:  I have reviewed the labs as listed.  CBC Latest Ref Rng & Units 12/03/2020 11/14/2011  WBC 4.0 - 10.5 K/uL 5.8 5.2  Hemoglobin 13.0 - 17.0 g/dL 12.1(L) 12.1(L)  Hematocrit 39.0 - 52.0 % 38.9(L) 37.3(L)  Platelets 150 - 400 K/uL 197 112(L)   CMP Latest Ref Rng & Units 12/14/2020 11/14/2011  Glucose 70 - 99 mg/dL 144(H) 113(H)  BUN 6 - 20 mg/dL 12 25(H)  Creatinine 0.61 - 1.24 mg/dL 1.09 1.51(H)  Sodium 135 - 145 mmol/L 137 135  Potassium 3.5 - 5.1 mmol/L 4.7 3.2(L)  Chloride 98 - 111 mmol/L 101 101  CO2 22 - 32 mmol/L 27 27  Calcium 8.9 - 10.3 mg/dL 9.0 8.6  Total Protein 6.0 - 8.3 g/dL - 6.5  Total Bilirubin 0.3 - 1.2 mg/dL - 0.5  Alkaline Phos 39 - 117 U/L - 51  AST 0 - 37 U/L - 19  ALT 0 - 53 U/L - 14    DIAGNOSTIC IMAGING:  I have independently reviewed the scans and discussed with the patient. No results found.   ASSESSMENT:  1.  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 -Thyroid [facility] on 09/03/2020 at [facility] Findings suggestive of multinodular goiter.  Solitary bilateral thyroid nodules, right mid lobe measuring 7.2 x 6 x 4.1 cm.  Nodule #2 in the left mid thyroid measures 2.1 x 1.5 x 1.3 cm. -Thyroid FNA of right thyroid nodule on 09/10/2020 suspicious for Hurthle cell lesion, Bethesda category 4. -FNA of the left thyroid lesion shows scant follicular epithelium, Bethesda category 1, nondiagnostic. -Geoffrey White denies any  hoarseness or dysphagia. -Reported weight loss of 5 to 10 pounds in the last 3 months, but his appetite has been good.   2.  Social/family history: -Geoffrey White is a group home resident [employer] transport [employer] in [place]) due to schizophrenia and intellectual disabilities. -His mother Kendra Opitz works at [employer]. -No family history of malignancies   PLAN:  1.  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 - We reviewed pathology report from 12/13/2020 which showed 6 cm Hurthle cell neoplasm, focally follicles bulging to the capsule and vascular spaces but true invasion is not identified supporting low-grade B-cell neoplasm.  2.  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  [id number].  All margins negative.  Extrathyroidal extension is negative. - Geoffrey White does not require any radioactive iodine. - His postoperative thyroglobulin levels were undetectable. - Postoperative TSH on 01/10/2021 was 31.7. - Geoffrey White was started on Synthroid 100 mcg daily.  We will check his TSH level today and in 2 months.    Orders placed this encounter:  No orders of the defined types were placed in this encounter.    Derek Jack, MD [facility] [phone]   I, Thana Ates, am acting as a scribe for Dr. Derek Jack.  I, Derek Jack MD, have reviewed the above documentation for accuracy and completeness, and I agree with the above.

## 2021-01-27 ENCOUNTER — Inpatient Hospital Stay (HOSPITAL_COMMUNITY): Payer: Medicare Other | Attending: Hematology | Admitting: Hematology

## 2021-01-27 ENCOUNTER — Other Ambulatory Visit: Payer: Self-pay

## 2021-01-27 VITALS — BP 117/72 | HR 73 | Temp 96.9°F | Resp 18 | Wt 202.4 lb

## 2021-01-27 DIAGNOSIS — Z79899 Other long term (current) drug therapy: Secondary | ICD-10-CM | POA: Diagnosis not present

## 2021-01-27 DIAGNOSIS — C73 Malignant neoplasm of thyroid gland: Secondary | ICD-10-CM | POA: Diagnosis present

## 2021-01-27 DIAGNOSIS — D34 Benign neoplasm of thyroid gland: Secondary | ICD-10-CM

## 2021-01-27 NOTE — Patient Instructions (Addendum)
[facility] at [facility] Discharge Instructions  You were seen today by Dr. Katragadda. He went over your recent results. Dr. Katragadda will see you back in 2 months for labs and follow up.   Thank you for choosing [facility] at [facility] to provide your oncology and hematology care.  To afford each patient quality time with our provider, please arrive at least 15 minutes before your scheduled appointment time.   If you have a lab appointment with the Cancer Center please come in thru the Main Entrance and check in at the main information desk  You need to re-schedule your appointment should you arrive 10 or more minutes late.  We strive to give you quality time with our providers, and arriving late affects you and other patients whose appointments are after yours.  Also, if you no show three or more times for appointments you may be dismissed from the clinic at the providers discretion.     Again, thank you for choosing [facility].  Our hope is that these requests will decrease the amount of time that you wait before being seen by our physicians.       _____________________________________________________________  Should you have questions after your visit to [facility], please contact our office at [phone] between the hours of 8:00 a.m. and 4:30 p.m.  Voicemails left after 4:00 p.m. will not be returned until the following business day.  For prescription refill requests, have your pharmacy contact our office and allow 72 hours.    Cancer Center Support Programs:   > Cancer Support Group  2nd Tuesday of the month 1pm-2pm, Journey Room   

## 2021-01-28 LAB — TSH: TSH: 59.37 u[IU]/mL — ABNORMAL HIGH (ref 0.350–4.500)

## 2021-03-26 ENCOUNTER — Inpatient Hospital Stay (HOSPITAL_COMMUNITY): Payer: Medicare Other | Attending: Hematology

## 2021-03-26 ENCOUNTER — Other Ambulatory Visit: Payer: Self-pay

## 2021-03-26 DIAGNOSIS — R634 Abnormal weight loss: Secondary | ICD-10-CM | POA: Insufficient documentation

## 2021-03-26 DIAGNOSIS — Z79899 Other long term (current) drug therapy: Secondary | ICD-10-CM | POA: Insufficient documentation

## 2021-03-26 DIAGNOSIS — D34 Benign neoplasm of thyroid gland: Secondary | ICD-10-CM | POA: Diagnosis not present

## 2021-03-26 LAB — T4, FREE: Free T4: 1.8 ng/dL — ABNORMAL HIGH (ref 0.61–1.12)

## 2021-03-26 LAB — COMPREHENSIVE METABOLIC PANEL
ALT: 24 U/L (ref 0–44)
AST: 24 U/L (ref 15–41)
Albumin: 4 g/dL (ref 3.5–5.0)
Alkaline Phosphatase: 60 U/L (ref 38–126)
Anion gap: 6 (ref 5–15)
BUN: 14 mg/dL (ref 6–20)
CO2: 26 mmol/L (ref 22–32)
Calcium: 8.7 mg/dL — ABNORMAL LOW (ref 8.9–10.3)
Chloride: 102 mmol/L (ref 98–111)
Creatinine, Ser: 1.13 mg/dL (ref 0.61–1.24)
GFR, Estimated: >60 mL/min (ref >60)
Glucose, Bld: 113 mg/dL — ABNORMAL HIGH (ref 70–99)
Potassium: 3.6 mmol/L (ref 3.5–5.1)
Sodium: 134 mmol/L — ABNORMAL LOW (ref 135–145)
Total Bilirubin: 0.9 mg/dL (ref 0.3–1.2)
Total Protein: 7.7 g/dL (ref 6.5–8.1)

## 2021-03-26 LAB — TSH: TSH: 0.296 u[IU]/mL — ABNORMAL LOW (ref 0.350–4.500)

## 2021-03-27 LAB — THYROGLOBULIN ANTIBODY: Thyroglobulin Antibody: <1 IU/mL (ref 0.0–0.9)

## 2021-04-02 ENCOUNTER — Other Ambulatory Visit: Payer: Self-pay

## 2021-04-02 ENCOUNTER — Inpatient Hospital Stay (HOSPITAL_BASED_OUTPATIENT_CLINIC_OR_DEPARTMENT_OTHER): Payer: Medicare Other | Admitting: Hematology

## 2021-04-02 VITALS — BP 118/73 | HR 80 | Temp 97.0°F | Resp 18 | Wt 199.7 lb

## 2021-04-02 DIAGNOSIS — D34 Benign neoplasm of thyroid gland: Secondary | ICD-10-CM | POA: Diagnosis not present

## 2021-04-02 NOTE — Patient Instructions (Addendum)
[facility] at [facility] °Discharge Instructions ° °You were seen today by Dr. Katragadda. He went over your recent results. Dr. Katragadda will see you back in 4 months for labs and follow up. ° ° °Thank you for choosing [facility] at [facility] to provide your oncology and hematology care.  To afford each patient quality time with our provider, please arrive at least 15 minutes before your scheduled appointment time.  ° °If you have a lab appointment with the Cancer Center please come in thru the Main Entrance and check in at the main information desk ° °You need to re-schedule your appointment should you arrive 10 or more minutes late.  We strive to give you quality time with our providers, and arriving late affects you and other patients whose appointments are after yours.  Also, if you no show three or more times for appointments you may be dismissed from the clinic at the providers discretion.     °Again, thank you for choosing [facility].  Our hope is that these requests will decrease the amount of time that you wait before being seen by our physicians.       °_____________________________________________________________ ° °Should you have questions after your visit to [facility], please contact our office at [phone] between the hours of 8:00 a.m. and 4:30 p.m.  Voicemails left after 4:00 p.m. will not be returned until the following business day.  For prescription refill requests, have your pharmacy contact our office and allow 72 hours.   ° °Cancer Center Support Programs:  ° °> Cancer Support Group  °2nd Tuesday of the month 1pm-2pm, Journey Room  ° ° °

## 2021-04-02 NOTE — Progress Notes (Signed)
Geoffrey White, Geoffrey White [id number]   CLINIC:  Medical Oncology/Hematology  PCP:  Neale Burly, MD [place] / [place] [phone]   REASON FOR VISIT:  Follow-up for Hurthle cell lesion  PRIOR THERAPY: none  NGS Results: not done  CURRENT THERAPY: surveillance  BRIEF ONCOLOGIC HISTORY:  Oncology History   No history exists.    CANCER STAGING: Cancer Staging No matching staging information was found for the patient.  INTERVAL HISTORY:  [name], a 46 y.o. 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 Geoffrey White was last seen on 01/27/21.   Today he reports feeling good. He reports regular BM.   REVIEW OF SYSTEMS:  Review of Systems  Constitutional:  Negative for appetite change and fatigue.  Gastrointestinal:  Negative for blood in stool, constipation and diarrhea.  All other systems reviewed and are negative.  PAST MEDICAL/SURGICAL HISTORY:  Past Medical History:  Diagnosis Date   Autism    Past Surgical History:  Procedure Laterality Date   THYROIDECTOMY N/A 12/13/2020   Procedure: TOTAL THYROIDECTOMY;  Surgeon: Armandina Gemma, MD;  Location: [facility];  Service: General;  Laterality: N/A;    SOCIAL HISTORY:  Social History   Socioeconomic History   Marital status: Single    Spouse name: Not on file   Number of children: Not on file   Years of education: Not on file   Highest education level: Not on file  Occupational History   Not on file  Tobacco Use   Smoking status: Never   Smokeless tobacco: Never  Vaping Use   Vaping Use: Never used  Substance and Sexual Activity   Alcohol use: No   Drug use: No   Sexual activity: Not on file  Other Topics Concern   Not on file  Social History Narrative   Not on file   Social Determinants of Health   Financial Resource Strain: Not on file  Food Insecurity: Not on file  Transportation Needs: Not on file  Physical  Activity: Not on file  Stress: Not on file  Social Connections: Not on file  Intimate Partner Violence: Not on file    FAMILY HISTORY:  No family history on file.  CURRENT MEDICATIONS:  Current Outpatient Medications  Medication Sig Dispense Refill   benztropine (COGENTIN) 1 MG tablet Take 1 mg by mouth in the morning, at noon, and at bedtime.     calcium carbonate (TUMS) 500 MG chewable tablet Chew 2 tablets (400 mg of elemental calcium total) by mouth 3 (three) times daily. 90 tablet 1   divalproex (DEPAKOTE ER) 500 MG 24 hr tablet Take 500 mg by mouth in the morning and at bedtime.     ergocalciferol (VITAMIN D2) 1.25 MG (50000 UT) capsule Take 50,000 Units by mouth every Thursday.     haloperidol (HALDOL) 5 MG tablet Take 5 mg by mouth 3 (three) times daily.     levocetirizine (XYZAL) 5 MG tablet Take 5 mg by mouth every morning.     levothyroxine (SYNTHROID) 100 MCG tablet Take 1 tablet (100 mcg total) by mouth daily before breakfast. 30 tablet 2   LORazepam (ATIVAN) 1 MG tablet Take 1 mg by mouth 2 (two) times daily.     Omega-3 Fatty Acids (FISH OIL) 1000 MG CAPS Take 1 capsule by mouth in the morning, at noon, and at bedtime.     propranolol (INDERAL) 20 MG tablet Take  20 mg by mouth 2 (two) times daily.     risperiDONE (RISPERDAL) 3 MG tablet Take 3 mg by mouth 2 (two) times daily.     traMADol (ULTRAM) 50 MG tablet Take 1-2 tablets (50-100 mg total) by mouth every 6 (six) hours as needed for moderate pain. 15 tablet 0   No current facility-administered medications for this visit.    ALLERGIES:  No Known Allergies  PHYSICAL EXAM:  Performance status (ECOG): 0 - Asymptomatic  There were no vitals filed for this visit. Wt Readings from Last 3 Encounters:  01/27/21 202 lb 6.4 oz (91.8 kg)  12/13/20 200 lb 6.4 oz (90.9 kg)  12/05/20 200 lb 4.8 oz (90.9 kg)   Physical Exam Vitals reviewed.  Constitutional:      Appearance: Normal appearance.  Cardiovascular:     Rate  and Rhythm: Normal rate and regular rhythm.     Pulses: Normal pulses.     Heart sounds: Normal heart sounds.  Pulmonary:     Effort: Pulmonary effort is normal.     Breath sounds: Normal breath sounds.  Lymphadenopathy:     Cervical: No cervical adenopathy.     Right cervical: No superficial cervical adenopathy.    Left cervical: No superficial cervical adenopathy.     Upper Body:     Right upper body: No supraclavicular adenopathy.     Left upper body: No supraclavicular adenopathy.  Neurological:     General: No focal deficit present.     Mental Status: He is alert and oriented to person, place, and time.  Psychiatric:        Mood and Affect: Mood normal.        Behavior: Behavior normal.     LABORATORY DATA:  I have reviewed the labs as listed.  CBC Latest Ref Rng & Units 12/03/2020 11/14/2011  WBC 4.0 - 10.5 K/uL 5.8 5.2  Hemoglobin 13.0 - 17.0 g/dL 12.1(L) 12.1(L)  Hematocrit 39.0 - 52.0 % 38.9(L) 37.3(L)  Platelets 150 - 400 K/uL 197 112(L)   CMP Latest Ref Rng & Units 03/26/2021 12/14/2020 11/14/2011  Glucose 70 - 99 mg/dL 113(H) 144(H) 113(H)  BUN 6 - 20 mg/dL 14 12 25(H)  Creatinine 0.61 - 1.24 mg/dL 1.13 1.09 1.51(H)  Sodium 135 - 145 mmol/L 134(L) 137 135  Potassium 3.5 - 5.1 mmol/L 3.6 4.7 3.2(L)  Chloride 98 - 111 mmol/L 102 101 101  CO2 22 - 32 mmol/L '26 27 27  '$ Calcium 8.9 - 10.3 mg/dL 8.7(L) 9.0 8.6  Total Protein 6.5 - 8.1 g/dL 7.7 - 6.5  Total Bilirubin 0.3 - 1.2 mg/dL 0.9 - 0.5  Alkaline Phos 38 - 126 U/L 60 - 51  AST 15 - 41 U/L 24 - 19  ALT 0 - 44 U/L 24 - 14    DIAGNOSTIC IMAGING:  I have independently reviewed the scans and discussed with the patient. No results found.   ASSESSMENT:  1.  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 -Thyroid ultrasound on 09/03/2020 at [facility] Findings suggestive of multinodular goiter.  Solitary bilateral thyroid nodules, right mid lobe  measuring 7.2 x 6 x 4.1 cm.  Nodule #2 in the left mid thyroid measures 2.1 x 1.5 x 1.3 cm. -Thyroid FNA of right thyroid nodule on 09/10/2020 suspicious for Hurthle cell lesion, Bethesda category 4. -FNA of the left thyroid lesion shows scant follicular epithelium, Bethesda category 1, nondiagnostic. -  He denies any hoarseness or dysphagia. -Reported weight loss of 5 to 10 pounds in the last 3 months, but his appetite has been good. - Total thyroidectomy on 12/13/2020.  Pathology showed 6 cm Hurthle cell neoplasm, focally follicles bulging to the capsular and vascular spaces but true invasion is not identified supporting low-grade neoplasm. - Pathology also showed 1.4 cm noninvasive follicular thyroid neoplasm with no angioinvasion or lymphatic invasion.  PT1BPNX.  All margins negative.  Extrathyroidal extension is negative. - He does not require radioactive iodine.  Post thyroidectomy thyroglobulin levels were undetectable.   2.  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 -His mother Geoffrey White works at [employer]. -No family history of malignancies   PLAN:  1.  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  No dysphagia.  We will closely monitor.  2.  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 - Continue Synthroid 100 mcg daily. - Reviewed labs which showed TSH 0.29.  T4 is 1.8 and antithyroglobulin antibodies were undetectable. - RTC 4 months with repeat labs including thyroglobulin levels and antithyroglobulin antibodies.   Orders placed this encounter:  No orders of the defined types were placed in this encounter.    Derek Jack, MD [place] [phone]   I, Thana Ates, am acting as a scribe for Dr. Derek Jack.  I, Derek Jack MD, have reviewed the above documentation for accuracy and completeness, and I agree with the above.

## 2021-07-30 ENCOUNTER — Other Ambulatory Visit (HOSPITAL_COMMUNITY): Payer: Self-pay

## 2021-07-30 DIAGNOSIS — D34 Benign neoplasm of thyroid gland: Secondary | ICD-10-CM

## 2021-07-31 ENCOUNTER — Inpatient Hospital Stay (HOSPITAL_COMMUNITY): Payer: Medicare Other | Attending: Hematology

## 2021-07-31 DIAGNOSIS — Z79899 Other long term (current) drug therapy: Secondary | ICD-10-CM | POA: Diagnosis not present

## 2021-07-31 DIAGNOSIS — R634 Abnormal weight loss: Secondary | ICD-10-CM | POA: Insufficient documentation

## 2021-07-31 DIAGNOSIS — D34 Benign neoplasm of thyroid gland: Secondary | ICD-10-CM | POA: Diagnosis present

## 2021-07-31 DIAGNOSIS — R197 Diarrhea, unspecified: Secondary | ICD-10-CM | POA: Insufficient documentation

## 2021-07-31 LAB — COMPREHENSIVE METABOLIC PANEL
ALT: 25 U/L (ref 0–44)
AST: 22 U/L (ref 15–41)
Albumin: 4 g/dL (ref 3.5–5.0)
Alkaline Phosphatase: 56 U/L (ref 38–126)
Anion gap: 7 (ref 5–15)
BUN: 13 mg/dL (ref 6–20)
CO2: 30 mmol/L (ref 22–32)
Calcium: 9 mg/dL (ref 8.9–10.3)
Chloride: 100 mmol/L (ref 98–111)
Creatinine, Ser: 1.16 mg/dL (ref 0.61–1.24)
GFR, Estimated: >60 mL/min (ref >60)
Glucose, Bld: 94 mg/dL (ref 70–99)
Potassium: 3.8 mmol/L (ref 3.5–5.1)
Sodium: 137 mmol/L (ref 135–145)
Total Bilirubin: 1 mg/dL (ref 0.3–1.2)
Total Protein: 7.3 g/dL (ref 6.5–8.1)

## 2021-07-31 LAB — TSH: TSH: 0.589 u[IU]/mL (ref 0.350–4.500)

## 2021-07-31 LAB — T4, FREE: Free T4: 1.55 ng/dL — ABNORMAL HIGH (ref 0.61–1.12)

## 2021-08-01 LAB — THYROGLOBULIN ANTIBODY: Thyroglobulin Antibody: <1 IU/mL (ref 0.0–0.9)

## 2021-08-06 NOTE — Progress Notes (Signed)
[place],  [id number]   CLINIC:  Medical Oncology/Hematology  PCP:  Neale Burly, MD [place] / [place] [id number] [phone]   REASON FOR VISIT:  Follow-up for Hurthle cell lesion  PRIOR THERAPY: none  NGS Results: not done  CURRENT THERAPY: surveillance  BRIEF ONCOLOGIC HISTORY:  Oncology History   No history exists.    CANCER STAGING:  Cancer Staging  No matching staging information was found for the patient.  INTERVAL HISTORY:  [name], a 45 y.o. 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 Geoffrey White was last seen on 04/02/2021.   Today he reports feeling good. He reports frequent watery diarrhea 3 times a day following meals. He reports feeling cold often. He denies weight loss. He denies any antibiotic use within the past 3 months. He is taking fish oil TID.    REVIEW OF SYSTEMS:  Review of Systems  Constitutional:  Negative for appetite change, fatigue and unexpected weight change.  All other systems reviewed and are negative.  PAST MEDICAL/SURGICAL HISTORY:  Past Medical History:  Diagnosis Date   Autism    Past Surgical History:  Procedure Laterality Date   THYROIDECTOMY N/A 12/13/2020   Procedure: TOTAL THYROIDECTOMY;  Surgeon: Geoffrey Gemma, MD;  Location: [facility];  Service: General;  Laterality: N/A;    SOCIAL HISTORY:  Social History   Socioeconomic History   Marital status: Single    Spouse name: Not on file   Number of children: Not on file   Years of education: Not on file   Highest education level: Not on file  Occupational History   Not on file  Tobacco Use   Smoking status: Never   Smokeless tobacco: Never  Vaping Use   Vaping Use: Never used  Substance and Sexual Activity   Alcohol use: No   Drug use: No   Sexual activity: Not on file  Other Topics Concern   Not on file  Social History Narrative   Not on file   Social Determinants of  Health   Financial Resource Strain: Not on file  Food Insecurity: Not on file  Transportation Needs: Not on file  Physical Activity: Not on file  Stress: Not on file  Social Connections: Not on file  Intimate Partner Violence: Not on file    FAMILY HISTORY:  No family history on file.  CURRENT MEDICATIONS:  Current Outpatient Medications  Medication Sig Dispense Refill   benztropine (COGENTIN) 1 MG tablet Take 1 mg by mouth in the morning, at noon, and at bedtime.     divalproex (DEPAKOTE ER) 500 MG 24 hr tablet Take 500 mg by mouth in the morning and at bedtime.     ergocalciferol (VITAMIN D2) 1.25 MG (50000 UT) capsule Take 50,000 Units by mouth every Thursday.     haloperidol (HALDOL) 5 MG tablet Take 5 mg by mouth 3 (three) times daily.     levocetirizine (XYZAL) 5 MG tablet Take 5 mg by mouth every morning.     levothyroxine (SYNTHROID) 100 MCG tablet Take 1 tablet (100 mcg total) by mouth daily before breakfast. 30 tablet 2   LORazepam (ATIVAN) 1 MG tablet Take 1 mg by mouth 2 (two) times daily.     Omega-3 Fatty Acids (FISH OIL) 1000 MG CAPS Take 1 capsule by mouth in the morning, at noon, and at bedtime.     omeprazole (PRILOSEC) 40 MG capsule Take  40 mg by mouth daily.     propranolol (INDERAL) 20 MG tablet Take 20 mg by mouth 2 (two) times daily.     risperiDONE (RISPERDAL) 3 MG tablet Take 3 mg by mouth 2 (two) times daily.     traMADol (ULTRAM) 50 MG tablet Take 1-2 tablets (50-100 mg total) by mouth every 6 (six) hours as needed for moderate pain. (Patient not taking: Reported on 08/07/2021) 15 tablet 0   No current facility-administered medications for this visit.    ALLERGIES:  No Known Allergies  PHYSICAL EXAM:  Performance status (ECOG): 0 - Asymptomatic  Vitals:   08/07/21 1104  BP: 111/74  Pulse: 70  Resp: 18  Temp: 97.7 F (36.5 C)  SpO2: 96%   Wt Readings from Last 3 Encounters:  08/07/21 202 lb 2.6 oz (91.7 kg)  04/02/21 199 lb 11.8 oz (90.6 kg)   01/27/21 202 lb 6.4 oz (91.8 kg)   Physical Exam Vitals reviewed.  Constitutional:      Appearance: Normal appearance.  Cardiovascular:     Rate and Rhythm: Normal rate and regular rhythm.     Pulses: Normal pulses.     Heart sounds: Normal heart sounds.  Pulmonary:     Effort: Pulmonary effort is normal.     Breath sounds: Normal breath sounds.  Lymphadenopathy:     Cervical: No cervical adenopathy.     Right cervical: No superficial cervical adenopathy.    Left cervical: No superficial cervical adenopathy.  Neurological:     General: No focal deficit present.     Mental Status: He is alert and oriented to person, place, and time.  Psychiatric:        Mood and Affect: Mood normal.        Behavior: Behavior normal.     LABORATORY DATA:  I have reviewed the labs as listed.  CBC Latest Ref Rng & Units 12/03/2020 11/14/2011  WBC 4.0 - 10.5 K/uL 5.8 5.2  Hemoglobin 13.0 - 17.0 g/dL 12.1(L) 12.1(L)  Hematocrit 39.0 - 52.0 % 38.9(L) 37.3(L)  Platelets 150 - 400 K/uL 197 112(L)   CMP Latest Ref Rng & Units 07/31/2021 03/26/2021 12/14/2020  Glucose 70 - 99 mg/dL 94 113(H) 144(H)  BUN 6 - 20 mg/dL 13 14 12   Creatinine 0.61 - 1.24 mg/dL 1.16 1.13 1.09  Sodium 135 - 145 mmol/L 137 134(L) 137  Potassium 3.5 - 5.1 mmol/L 3.8 3.6 4.7  Chloride 98 - 111 mmol/L 100 102 101  CO2 22 - 32 mmol/L 30 26 27   Calcium 8.9 - 10.3 mg/dL 9.0 8.7(L) 9.0  Total Protein 6.5 - 8.1 g/dL 7.3 7.7 -  Total Bilirubin 0.3 - 1.2 mg/dL 1.0 0.9 -  Alkaline Phos 38 - 126 U/L 56 60 -  AST 15 - 41 U/L 22 24 -  ALT 0 - 44 U/L 25 24 -    DIAGNOSTIC IMAGING:  I have independently reviewed the scans and discussed with the patient. No results found.   ASSESSMENT:  1.  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 -Thyroid ultrasound on 09/03/2020 at [facility] Findings suggestive of multinodular goiter.  Solitary bilateral thyroid nodules,  right mid lobe measuring 7.2 x 6 x 4.1 cm.  Nodule #2 in the left mid thyroid measures 2.1 x 1.5 x 1.3 cm. -Thyroid FNA of right thyroid nodule on 09/10/2020 suspicious for Hurthle cell lesion, Bethesda category 4. -FNA of  the left thyroid lesion shows scant follicular epithelium, Bethesda category 1, nondiagnostic. -He denies any hoarseness or dysphagia. -Reported weight loss of 5 to 10 pounds in the last 3 months, but his appetite has been good. - Total thyroidectomy on 12/13/2020.  Pathology showed 6 cm Hurthle cell neoplasm, focally follicles bulging to the capsular and vascular spaces but true invasion is not identified supporting low-grade neoplasm. - Pathology also showed 1.4 cm noninvasive follicular thyroid neoplasm with no angioinvasion or lymphatic invasion.  PT1BPNX.  All margins negative.  Extrathyroidal extension is negative. - He does not require radioactive iodine.  Post thyroidectomy thyroglobulin levels were undetectable.   2.  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 -His mother Geoffrey White works at [employer]. -No family history of malignancies   PLAN:  1.  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  No dysphagia.  2.  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 - Physical examination today did not reveal any neck masses. - Reviewed labs from 07/31/2021 which showed normal comprehensive metabolic panel.  Calcium was normal. - TSH was 0.589.  Direct T4 is 1.55.  Thyroglobulin antibodies are undetectable. - We will increase Synthroid to 125 mcg daily. - RTC 2 months with repeat TSH.  3.  Watery stools: - He is reportedly having watery stools after each meal.  He is also complaining of feeling cold. - Watery stools coincides with fish  oil which was reportedly increased to 3 times daily after surgery.  He was reportedly taking it once daily. - Will consider decreasing facial oil to once daily. - We will send stool for GI panel.   Orders placed this encounter:  Orders Placed This Encounter  Procedures   Gastrointestinal Panel by PCR , Stool     Derek Jack, MD [place] [phone]   I, Thana Ates, am acting as a scribe for Dr. Derek Jack.  I, Derek Jack MD, have reviewed the above documentation for accuracy and completeness, and I agree with the above.

## 2021-08-07 ENCOUNTER — Other Ambulatory Visit: Payer: Self-pay

## 2021-08-07 ENCOUNTER — Inpatient Hospital Stay (HOSPITAL_BASED_OUTPATIENT_CLINIC_OR_DEPARTMENT_OTHER): Payer: Medicare Other | Admitting: Hematology

## 2021-08-07 VITALS — BP 111/74 | HR 70 | Temp 97.7°F | Resp 18 | Ht 73.0 in | Wt 202.2 lb

## 2021-08-07 DIAGNOSIS — D34 Benign neoplasm of thyroid gland: Secondary | ICD-10-CM

## 2021-08-07 MED ORDER — LEVOTHYROXINE SODIUM 125 MCG PO TABS: 125.0000 ug | ORAL_TABLET | Freq: Every day | ORAL | 3 refills | Status: DC

## 2021-08-07 NOTE — Patient Instructions (Addendum)
[place] at [facility] Discharge Instructions   You were seen and examined today by Dr. Delton Coombes.  He reviewed you lab work, which was normal/stable.   Decrease fish oil to once a day to see if this helps resolve your diarrhea.  We will give you materials to collect a stool sample for testing to rule out any gastrointestinal parasites or infection. Please have your family member bring this sample  We will increase your levothyroxine (Synthroid) to 125 mcg daily.   Return as scheduled in 2 months.    Thank you for choosing [place] at [facility] to provide your oncology and hematology care.  To afford each patient quality time with our provider, please arrive at least 15 minutes before your scheduled appointment time.   If you have a lab appointment with the [facility] please come in thru the Main Entrance and check in at the main information desk.  You need to re-schedule your appointment should you arrive 10 or more minutes late.  We strive to give you quality time with our providers, and arriving late affects you and other patients whose appointments are after yours.  Also, if you no show three or more times for appointments you may be dismissed from the clinic at the providers discretion.     Again, thank you for choosing [facility].  Our hope is that these requests will decrease the amount of time that you wait before being seen by our physicians.       _____________________________________________________________  Should you have questions after your visit to [facility], please contact our office at [phone] and follow the prompts.  Our office hours are 8:00 a.m. and 4:30 p.m. Monday - Friday.  Please note that voicemails left after 4:00 p.m. may not be returned until the following business day.  We are closed weekends and major holidays.  You do have access to a nurse 24-7, just call the main  number to the clinic [phone] and do not press any options, hold on the line and a nurse will answer the phone.    For prescription refill requests, have your pharmacy contact our office and allow 72 hours.    Due to Covid, you will need to wear a mask upon entering the hospital. If you do not have a mask, a mask will be given to you at the Main Entrance upon arrival. For doctor visits, patients may have 1 support person age 62 or older with them. For treatment visits, patients can not have anyone with them due to social distancing guidelines and our immunocompromised population.

## 2021-08-09 LAB — THYROGLOBULIN LEVEL: Thyroglobulin: <2 ng/mL

## 2021-10-02 ENCOUNTER — Inpatient Hospital Stay (HOSPITAL_COMMUNITY): Payer: Medicare Other | Attending: Hematology

## 2021-10-09 ENCOUNTER — Ambulatory Visit (HOSPITAL_COMMUNITY): Payer: Medicaid Other | Admitting: Hematology

## 2021-10-20 ENCOUNTER — Other Ambulatory Visit (HOSPITAL_COMMUNITY): Payer: Self-pay | Admitting: *Deleted

## 2021-10-20 MED ORDER — LEVOTHYROXINE SODIUM 125 MCG PO TABS: 125.0000 ug | ORAL_TABLET | Freq: Every day | ORAL | 3 refills | Status: AC

## 2023-11-24 ENCOUNTER — Emergency Department (HOSPITAL_COMMUNITY)
Admission: EM | Admit: 2023-11-24 | Discharge: 2023-11-24 | Disposition: A | Discharge status: Home / Self Care | Attending: Emergency Medicine | Admitting: Emergency Medicine

## 2023-11-24 ENCOUNTER — Emergency Department (HOSPITAL_COMMUNITY)

## 2023-11-24 ENCOUNTER — Other Ambulatory Visit: Payer: Self-pay

## 2023-11-24 DIAGNOSIS — R270 Ataxia, unspecified: Secondary | ICD-10-CM | POA: Diagnosis present

## 2023-11-24 DIAGNOSIS — Z79899 Other long term (current) drug therapy: Secondary | ICD-10-CM | POA: Insufficient documentation

## 2023-11-24 LAB — COMPREHENSIVE METABOLIC PANEL WITH GFR
ALT: 19 U/L (ref 0–44)
AST: 20 U/L (ref 15–41)
Albumin: 3.3 g/dL — ABNORMAL LOW (ref 3.5–5.0)
Alkaline Phosphatase: 50 U/L (ref 38–126)
Anion gap: 8 (ref 5–15)
BUN: 12 mg/dL (ref 6–20)
CO2: 28 mmol/L (ref 22–32)
Calcium: 9 mg/dL (ref 8.9–10.3)
Chloride: 99 mmol/L (ref 98–111)
Creatinine, Ser: 1.2 mg/dL (ref 0.61–1.24)
GFR, Estimated: >60 mL/min (ref >60)
Glucose, Bld: 98 mg/dL (ref 70–99)
Potassium: 3.9 mmol/L (ref 3.5–5.1)
Sodium: 135 mmol/L (ref 135–145)
Total Bilirubin: 0.9 mg/dL (ref 0.0–1.2)
Total Protein: 6.5 g/dL (ref 6.5–8.1)

## 2023-11-24 LAB — DIFFERENTIAL
Abs Immature Granulocytes: 0.01 10*3/uL (ref 0.00–0.07)
Basophils Absolute: 0 10*3/uL (ref 0.0–0.1)
Basophils Relative: 0 %
Eosinophils Absolute: 0.1 10*3/uL (ref 0.0–0.5)
Eosinophils Relative: 2 %
Immature Granulocytes: 0 %
Lymphocytes Relative: 37 %
Lymphs Abs: 1.9 10*3/uL (ref 0.7–4.0)
Monocytes Absolute: 0.5 10*3/uL (ref 0.1–1.0)
Monocytes Relative: 11 %
Neutro Abs: 2.5 10*3/uL (ref 1.7–7.7)
Neutrophils Relative %: 50 %

## 2023-11-24 LAB — CBC
HCT: 37.6 % — ABNORMAL LOW (ref 39.0–52.0)
Hemoglobin: 11.5 g/dL — ABNORMAL LOW (ref 13.0–17.0)
MCH: 23.6 pg — ABNORMAL LOW (ref 26.0–34.0)
MCHC: 30.6 g/dL (ref 30.0–36.0)
MCV: 77 fL — ABNORMAL LOW (ref 80.0–100.0)
Platelets: 196 10*3/uL (ref 150–400)
RBC: 4.88 MIL/uL (ref 4.22–5.81)
RDW: 15.1 % (ref 11.5–15.5)
WBC: 5.2 10*3/uL (ref 4.0–10.5)
nRBC: 0 % (ref 0.0–0.2)

## 2023-11-24 LAB — RESP PANEL BY RT-PCR (RSV, FLU A&B, COVID)  RVPGX2
Influenza A by PCR: NEGATIVE
Influenza B by PCR: NEGATIVE
Resp Syncytial Virus by PCR: NEGATIVE
SARS Coronavirus 2 by RT PCR: NEGATIVE

## 2023-11-24 LAB — PROTIME-INR
INR: 1 (ref 0.8–1.2)
Prothrombin Time: 13.2 s (ref 11.4–15.2)

## 2023-11-24 LAB — APTT: aPTT: 26 s (ref 24–36)

## 2023-11-24 LAB — ETHANOL: Alcohol, Ethyl (B): <10 mg/dL (ref <10)

## 2023-11-24 LAB — VALPROIC ACID LEVEL: Valproic Acid Lvl: 70 ug/mL (ref 50.0–100.0)

## 2023-11-24 MED ORDER — GADOBUTROL 1 MMOL/ML IV SOLN
7.5000 mL | Freq: Once | INTRAVENOUS | Status: AC | PRN
  Administered 2023-11-24: 7.5 mL via INTRAVENOUS

## 2023-11-24 NOTE — ED Triage Notes (Signed)
 Pt came in with staff stating that pt woke up this morning and his gait was not as steady as normal. Pt states he also has had a cough and runny nose.

## 2023-11-24 NOTE — Discharge Instructions (Signed)
 Follow-up closely with primary care doctor on an outpatient basis.  Return to emergency department immediately for any new or worsening symptoms.

## 2023-11-24 NOTE — ED Provider Notes (Signed)
 [facility] AT [facility] Provider Note   CSN: [id number] Arrival date & time: 11/24/23  1015     History  Chief Complaint  Patient presents with   Gait Problem    Geoffrey White is a 48 y.o. male.  48 year old male presents with gait abnormality.  Patient is accompanied by his caregiver as well as his mom, who are able to provide collateral information.  When getting ready to leave the house this morning, the patient's caregiver noticed that he was "unsteady on his feet, like he could fall", and this is abnormal for him as he typically ambulates without assistance.  His caregiver was concerned enough to immediately ask him to sit down to avoid falling.  Patient's caregiver and mother report that his behavior and mood are normal for his baseline, they have not observed any other concerning signs aside from his gait issue this morning.  Patient is unable to provide a clear history on his own due to underlying cognitive disability.  His only other complaint is seasonal allergies.        Home Medications Prior to Admission medications   Medication Sig Start Date End Date Taking? Authorizing Provider  benztropine (COGENTIN) 1 MG tablet Take 1 mg by mouth in the morning, at noon, and at bedtime.    [provider]  divalproex (DEPAKOTE ER) 500 MG 24 hr tablet Take 500 mg by mouth in the morning and at bedtime. 09/20/20   [provider]  ergocalciferol (VITAMIN D2) 1.25 MG (50000 UT) capsule Take 50,000 Units by mouth every Thursday. 08/23/20   [provider]  haloperidol (HALDOL) 5 MG tablet Take 5 mg by mouth 3 (three) times daily.    [provider]  levocetirizine (XYZAL) 5 MG tablet Take 5 mg by mouth every morning. 10/18/20   [provider]  levothyroxine (SYNTHROID) 125 MCG tablet Take 1 tablet (125 mcg total) by mouth daily before breakfast. 10/20/21   Doreatha Massed, MD  LORazepam (ATIVAN) 1 MG tablet  Take 1 mg by mouth 2 (two) times daily.    [provider]  Omega-3 Fatty Acids (FISH OIL) 1000 MG CAPS Take 1 capsule by mouth in the morning, at noon, and at bedtime.    [provider]  omeprazole (PRILOSEC) 40 MG capsule Take 40 mg by mouth daily. 03/14/21   [provider]  propranolol (INDERAL) 20 MG tablet Take 20 mg by mouth 2 (two) times daily. 08/23/20   [provider]  risperiDONE (RISPERDAL) 3 MG tablet Take 3 mg by mouth 2 (two) times daily.    [provider]  traMADol (ULTRAM) 50 MG tablet Take 1-2 tablets (50-100 mg total) by mouth every 6 (six) hours as needed for moderate pain. Patient not taking: Reported on 08/07/2021 12/14/20   Berna Bue, MD      Allergies    Patient has no known allergies.    Review of Systems   Review of Systems  Constitutional:  Negative for activity change and fever.  Respiratory:  Negative for cough.   Cardiovascular:  Negative for chest pain.  Neurological:  Negative for dizziness and headaches.  Psychiatric/Behavioral:  Negative for agitation.     Physical Exam Updated Vital Signs BP 122/83   Pulse 70   Temp 97.8 F (36.6 C) (Oral)   Resp 17   Ht 6\' 1"  (1.854 m)   Wt 87.1 kg   SpO2 98%   BMI 25.34 kg/m  Physical Exam Constitutional:      General: He is not in acute distress. HENT:     Head: Normocephalic and atraumatic.  Eyes:     Extraocular Movements: Extraocular movements intact.     Conjunctiva/sclera: Conjunctivae normal.  Pulmonary:     Effort: Pulmonary effort is normal.  Neurological:     Mental Status: He is alert. Mental status is at baseline.     Cranial Nerves: No facial asymmetry.     Coordination: Finger-Nose-Finger Test normal.     Comments: Gait: Narrow base, shuffling steps, no loss of balance - normal gait appearance according to patient's caregiver and mother  Limited participation in neurologic exam due to patient's underlying cognitive disability   Psychiatric:        Mood and Affect: Mood normal.     ED Results / Procedures / Treatments   Labs (all labs ordered are listed, but only abnormal results are displayed) Labs Reviewed  CBC - Abnormal; Notable for the following components:      Result Value   Hemoglobin 11.5 (*)    HCT 37.6 (*)    MCV 77.0 (*)    MCH 23.6 (*)    All other components within normal limits  COMPREHENSIVE METABOLIC PANEL WITH GFR - Abnormal; Notable for the following components:   Albumin 3.3 (*)    All other components within normal limits  RESP PANEL BY RT-PCR (RSV, FLU A&B, COVID)  RVPGX2  ETHANOL  PROTIME-INR  APTT  DIFFERENTIAL  VALPROIC ACID LEVEL  I-STAT CHEM 8, ED    EKG None  Radiology No results found.  Procedures Procedures    Medications Ordered in ED Medications - No data to display  ED Course/ Medical Decision Making/ A&P                                 Medical Decision Making Patient presents with gait disturbance.  Differential diagnosis includes stroke versus TIA, unspecified vertigo, electrolyte disturbance, medication noncompliance. Ataxia had resolved upon physical examination and has not recurred.   Spoke with patient's caregiver and mother, they feel that patient would tolerate MRI brain imaging well to assess for underlying cerebellar pathology that may be contributing to gait disturbance.  Educated them that patient will need to lie still for 30 minutes or so, and he will be subjected to loud noises.  Patient is agreeable with this plan.  Unlikely issue with medication noncompliance, as patient has caregiver and parent who help administer his medications.  Valproic acid levels are within therapeutic range, no concern for over or underdosing his time.  No electrolyte abnormalities noted on CMP.  Pertinent labs are as follows: CBC and CMP are unremarkable, valproic acid level is within therapeutic range.  Patient's care is complicated by underlying neurologic  disability.  Spoke with Lesle Reek PA-C at the end of my shift, he will assume care of this patient.  Amount and/or Complexity of Data Reviewed Independent Historian: parent and caregiver Labs: ordered. Radiology: ordered.           Final Clinical Impression(s) / ED Diagnoses Final diagnoses:  Ataxia    Rx / DC Orders ED Discharge Orders     None         Sara Chu 11/24/23 1536    Eber Hong, MD 11/24/23 [phone]

## 2023-11-24 NOTE — ED Provider Notes (Signed)
  Physical Exam  BP 136/85   Pulse 75   Temp 97.8 F (36.6 C) (Oral)   Resp 18   Ht 6\' 1"  (1.854 m)   Wt 87.1 kg   SpO2 100%   BMI 25.34 kg/m   Physical Exam  Procedures  Procedures  ED Course / MDM    Medical Decision Making Amount and/or Complexity of Data Reviewed Labs: ordered. Radiology: ordered.  Risk Prescription drug management.   Patient was signed out to myself at shift change.  Patient does remain stable at this point with no further acute complaints.  MRI of the brain demonstrated concerning area within the left cerebellum.  I did discuss this with the radiologist who did recommend contrasted study.  The contrasted MRI demonstrated no acute pathology and possible chronic microhemorrhage.  Did discuss this findings with attending who is in agreement the patient can be discharged home at this time.  The need for close follow-up with primary care doctor was discussed as well as strict return precautions for any new or worsening symptoms.  Family voiced understanding and had no additional questions.       Lelon Perla, PA-C 11/24/23 1850    Jacalyn Lefevre, MD 11/24/23 2328

## 2023-11-24 NOTE — ED Notes (Signed)
 MRI called to notify IV access

## 2023-12-23 NOTE — Progress Notes (Signed)
 Medically necessary for diagnosis

## 2024-07-06 ENCOUNTER — Encounter (INDEPENDENT_AMBULATORY_CARE_PROVIDER_SITE_OTHER): Payer: Self-pay | Admitting: *Deleted
